# Patient Record
Sex: Male | Born: 1960 | Race: White | Hispanic: No | Marital: Married | Smoking: Never smoker
Health system: Southern US, Community
[De-identification: ages and names within clinical notes are randomized; demographics above are authoritative.]

## PROBLEM LIST (undated history)

## (undated) DIAGNOSIS — J189 Pneumonia, unspecified organism: Secondary | ICD-10-CM

## (undated) DIAGNOSIS — E119 Type 2 diabetes mellitus without complications: Secondary | ICD-10-CM

## (undated) DIAGNOSIS — C801 Malignant (primary) neoplasm, unspecified: Secondary | ICD-10-CM

## (undated) DIAGNOSIS — F329 Major depressive disorder, single episode, unspecified: Secondary | ICD-10-CM

## (undated) DIAGNOSIS — E785 Hyperlipidemia, unspecified: Secondary | ICD-10-CM

## (undated) DIAGNOSIS — F32A Depression, unspecified: Secondary | ICD-10-CM

## (undated) DIAGNOSIS — R011 Cardiac murmur, unspecified: Secondary | ICD-10-CM

## (undated) DIAGNOSIS — I10 Essential (primary) hypertension: Secondary | ICD-10-CM

## (undated) DIAGNOSIS — F419 Anxiety disorder, unspecified: Secondary | ICD-10-CM

## (undated) HISTORY — DX: Essential (primary) hypertension: I10

## (undated) HISTORY — PX: COLONOSCOPY: SHX174

## (undated) HISTORY — DX: Malignant (primary) neoplasm, unspecified: C80.1

## (undated) HISTORY — DX: Hyperlipidemia, unspecified: E78.5

## (undated) HISTORY — DX: Type 2 diabetes mellitus without complications: E11.9

## (undated) HISTORY — DX: Cardiac murmur, unspecified: R01.1

---
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
## 1998-05-08 HISTORY — PX: OTHER SURGICAL HISTORY: SHX169

## 1999-12-18 ENCOUNTER — Emergency Department (HOSPITAL_COMMUNITY): Admission: EM | Admit: 1999-12-18 | Discharge: 1999-12-18 | Payer: Self-pay | Admitting: Emergency Medicine

## 1999-12-18 ENCOUNTER — Encounter: Payer: Self-pay | Admitting: Emergency Medicine

## 1999-12-19 ENCOUNTER — Ambulatory Visit (HOSPITAL_COMMUNITY): Admission: RE | Admit: 1999-12-19 | Discharge: 1999-12-19 | Payer: Self-pay | Admitting: Internal Medicine

## 1999-12-19 ENCOUNTER — Encounter: Payer: Self-pay | Admitting: Internal Medicine

## 2012-05-22 ENCOUNTER — Other Ambulatory Visit (HOSPITAL_BASED_OUTPATIENT_CLINIC_OR_DEPARTMENT_OTHER): Payer: Self-pay | Admitting: Family Medicine

## 2012-05-22 ENCOUNTER — Other Ambulatory Visit: Payer: Self-pay | Admitting: Family Medicine

## 2012-05-22 ENCOUNTER — Ambulatory Visit (INDEPENDENT_AMBULATORY_CARE_PROVIDER_SITE_OTHER): Payer: PRIVATE HEALTH INSURANCE

## 2012-05-22 ENCOUNTER — Ambulatory Visit (HOSPITAL_BASED_OUTPATIENT_CLINIC_OR_DEPARTMENT_OTHER): Payer: Self-pay

## 2012-05-22 DIAGNOSIS — R05 Cough: Secondary | ICD-10-CM

## 2012-05-22 DIAGNOSIS — R0602 Shortness of breath: Secondary | ICD-10-CM

## 2013-05-08 DIAGNOSIS — C801 Malignant (primary) neoplasm, unspecified: Secondary | ICD-10-CM

## 2013-05-08 HISTORY — DX: Malignant (primary) neoplasm, unspecified: C80.1

## 2013-08-25 ENCOUNTER — Encounter (INDEPENDENT_AMBULATORY_CARE_PROVIDER_SITE_OTHER): Payer: Self-pay | Admitting: General Surgery

## 2013-08-25 ENCOUNTER — Other Ambulatory Visit (INDEPENDENT_AMBULATORY_CARE_PROVIDER_SITE_OTHER): Payer: Self-pay

## 2013-08-25 ENCOUNTER — Ambulatory Visit (INDEPENDENT_AMBULATORY_CARE_PROVIDER_SITE_OTHER): Payer: PRIVATE HEALTH INSURANCE | Admitting: General Surgery

## 2013-08-25 VITALS — BP 110/80 | HR 84 | Temp 97.2°F | Resp 16 | Ht 71.0 in | Wt 238.4 lb

## 2013-08-25 DIAGNOSIS — C189 Malignant neoplasm of colon, unspecified: Secondary | ICD-10-CM

## 2013-08-25 DIAGNOSIS — D126 Benign neoplasm of colon, unspecified: Secondary | ICD-10-CM

## 2013-08-25 MED ORDER — METRONIDAZOLE 500 MG PO TABS: 500.0000 mg | ORAL_TABLET | ORAL | Status: DC

## 2013-08-25 NOTE — Progress Notes (Signed)
Chief Complaint  Patient presents with  . New Evaluation    eval desending colon CA    HISTORY: Robert Moore is a 53 y.o. male who presents to the office with colon cancer.  This was found on screeing colonoscopy.  The polyp was resected piecemeal from the proximal descending colon.  Biopsy shows adenocarcinoma extending to the base or the resected margin on several specimens.  Workup thus far has included CT abd, which was neg for any metastatic diease, and a CEA, which was normal.  Pt denies any rectal bleeding or changes in bowel habits or weight loss.       Past Medical History  Diagnosis Date  . Cancer   . Diabetes mellitus without complication   . Hypertension   . Hyperlipidemia   . Heart murmur       Past Surgical History  Procedure Laterality Date  . Ear tuck  1975  . Bone spur Right 2000    foot       Current Outpatient Prescriptions  Medication Sig Dispense Refill  . metroNIDAZOLE (FLAGYL) 500 MG tablet Take 1 tablet (500 mg total) by mouth as directed.  3 tablet  0   No current facility-administered medications for this visit.      No Known Allergies    History reviewed. No pertinent family history.    History   Social History  . Marital Status: Married    Spouse Name: N/A    Number of Children: N/A  . Years of Education: N/A   Social History Main Topics  . Smoking status: Never Smoker   . Smokeless tobacco: Never Used  . Alcohol Use: Yes     Comment: occ  . Drug Use: No  . Sexual Activity: None   Other Topics Concern  . None   Social History Narrative  . None       REVIEW OF SYSTEMS - PERTINENT POSITIVES ONLY: Review of Systems - General ROS: negative for - chills or fever Respiratory ROS: no cough, shortness of breath, or wheezing Cardiovascular ROS: no chest pain or dyspnea on exertion Gastrointestinal ROS: no abdominal pain, change in bowel habits, or black or bloody stools Genito-Urinary ROS: no dysuria, trouble voiding, or  hematuria  EXAM: Filed Vitals:   08/25/13 0941  BP: 110/80  Pulse: 84  Temp: 97.2 F (36.2 C)  Resp: 16    Gen:  No acute distress.  Well nourished and well groomed.   Neurological: Alert and oriented to person, place, and time. Coordination normal.  Head: Normocephalic and atraumatic.  Eyes: Conjunctivae are normal. Pupils are equal, round, and reactive to light. No scleral icterus.  Neck: Normal range of motion. Neck supple. No tracheal deviation or thyromegaly present.  No cervical lymphadenopathy. Cardiovascular: Normal rate, regular rhythm, normal heart sounds and intact distal pulses. Respiratory: Effort normal.  No respiratory distress. No chest wall tenderness. Breath sounds normal.  No wheezes, rales or rhonchi.  GI: Soft. Bowel sounds are normal. The abdomen is soft and nontender.  There is no rebound and no guarding.  Umbilical hernia present Musculoskeletal: Normal range of motion. Extremities are nontender.  Skin: Skin is warm and dry. No rash noted. No diaphoresis. No erythema. No pallor. No clubbing, cyanosis, or edema.   Psychiatric: Normal mood and affect. Behavior is normal. Judgment and thought content normal.    LABORATORY RESULTS: Available labs are reviewed  CEA: 1.9 WBC 9.7 Hgb: 16.1  RADIOLOGY RESULTS:   Images and reports are  reviewed. CT abd report reviewed.  No signs of metastatic disease.  Small liver masses: "probable cysts", mass at base of bladder    ASSESSMENT AND PLAN: Robert Moore is a 53 y.o. M with a colon cancer found on screening colonoscopy.  This was tattooed and found in the proximal descending colon.  We will complete his workup by obtaining a CT Chest.  I would also like to review the CT Abd images myself.  He has been seen by Dr Felipa Eth for this bladder mass, and cystoscopy did not reveal any mass per patient.  I will obtain these records as well.  Once this is complete, we will schedule for surgery.  Most likely it will be  Laparoscopic L colectomy.  We discussed the surgery in detail.  We discussed alternatives, which include repeat colonoscopy and biopsies with close endoscopic surveillance.  He has elected to proceed with surgery instead.  We discussed the possibility of finding nothing at all or a deeper cancer.    The surgery and anatomy were described to the patient as well as the risks of surgery and the possible complications.  These include: Bleeding, infection and possible wound complications such as hernia, damage to adjacent structures, leak of surgical connections, which can lead to other surgeries and possibly an ostomy (5-7%), possible need for other procedures, such as abscess drains in radiology, possible prolonged hospital stay, possible diarrhea from removal of part of the colon, possible constipation from narcotics, prolonged fatigue/weakness or appetite loss, possible early recurrence of cancer, possible complications of their medical problems such as heart disease or arrhythmias or lung problems, death (less than 1%).  I believe the patient understands and wishes to proceed with the surgery.     Rosario Adie, MD Colon and Rectal Surgery / [facility]      Visit Diagnoses: 1. Colon cancer     Primary Care Physician: Amador Cunas, FNP

## 2013-08-25 NOTE — Patient Instructions (Signed)
Colorectal Cancer  Colorectal cancer is the second most common cancer in the [place], striking 140,000 people annually and causing 60,000 deaths. That's a staggering figure when you consider the disease is potentially curable if diagnosed in the early stages. Who is at risk? Though colorectal cancer may occur at any age, more than 90% of the patients are over age 53, at which point the risk doubles every ten years. In addition to age, other high risk factors include a family history of colorectal cancer and polyps and a personal history of ulcerative colitis, colon polyps or cancer of other organs, especially of the breast or uterus. How does it start? It is generally agreed that nearly all colon and rectal cancer begins in benign polyps. These pre-malignant growths occur on the bowel wall and may eventually increase in size and become cancer. Removal of benign polyps is one aspect of preventive medicine that really works! What are the symptoms? The most common symptoms are rectal bleeding and changes in bowel habits, such as constipation or diarrhea. (These symptoms are also common in other diseases so it is important you receive a thorough examination should you experience them.) Abdominal pain and weight loss are usually late symptoms indicating possible extensive disease. Unfortunately, many polyps and early cancers fail to produce symptoms. Therefore, it is important that your routine physical includes colorectal cancer detection procedures once you reach age 50.  There are several methods for detection of colorectal cancer. These include digital rectal examination, a chemical test of the stool for blood, flexible sigmoidoscopy and colonoscopy (lighted tubular instruments used to inspect the lower bowel) and barium enema. Be sure to discuss these options with your surgeon to determine which procedure is best for you. Individuals who have a first-degree relative (parent or sibling) with colon  cancer or polyps should start their colon cancer screening at the age of 53. How is colorectal cancer treated? Colorectal cancer requires surgery in nearly all cases for complete cure. Radiation and chemotherapy are sometimes used in addition to surgery. Between 80-90% are restored to normal health if the cancer is detected and treated in the earliest stages. The cure rate drops to 50% or less when diagnosed in the later stages. Thanks to modern technology, less than 5% of all colorectal cancer patients require a colostomy, the surgical construction of an artificial excretory opening from the colon. Can colon cancer be prevented? Colon cancer is very preventable. The most important step towards preventing colon cancer is getting a screening test. Any abnormal screening test should be followed by a colonoscopy. Some individuals prefer to start with colonoscopy as a screening test. Colonoscopy provides a detailed examination of the bowel. Polyps can be identified and can often be removed during colonoscopy. Though not definitely proven, there is some evidence that diet may play a significant role in preventing colorectal cancer. As far as we know, a high fiber, low fat diet is the only dietary measure that might help prevent colorectal cancer. Finally, pay attention to changes in your bowel habits. Any new changes such as persistent constipation, diarrhea, or blood in the stool should be discussed with your physician.   Can hemorrhoids lead to colon cancer? No, but hemorrhoids may produce symptoms similar to colon polyps or cancer. Should you experience these symptoms, you should have them examined and evaluated by a physician, preferably by a colon and rectal surgeon. What is a colon and rectal surgeon? Colon and rectal surgeons are experts in the surgical and non-surgical treatment   of diseases of the colon, rectum and anus. They have completed advanced surgical training in the treatment of these  diseases as well as full general surgical training. Board-certified colon and rectal surgeons complete residencies in general surgery and colon and rectal surgery, and pass intensive examinations conducted by the American Board of Surgery and the American Board of Colon and Rectal Surgery. They are well-versed in the treatment of both benign and malignant diseases of the colon, rectum and anus and are able to perform routine screening examinations and surgically treat conditions if indicated to do so.  2012 American Society of Colon & Rectal Surgeons     [facility]  ONE-DAY (1) PRE-OP HOME COLON PREP INSTRUCTIONS: ** MIRALAX / GATORADE PREP / FLAGYL**  You must follow the instructions below carefully.  If you have questions or problems, please call and speak to someone in the clinic department at our office:   [phone].     INSTRUCTIONS: 1. Five days prior to your procedure do not eat nuts, popcorn, or fruit with seeds.  Stop all fiber supplements such as Metamucil, Citrucel, etc. 2. Two days before surgery fill the prescription at a pharmacy of your choice and purchase the additional supplies below.         MIRALAX - GATORADE -- DULCOLAX TABS -- FLEET ENEMA:   Purchase a bottle of MIRALAX  (255 gm bottle)    In addition, purchase four (4) DULCOLAX TABLETS (no prescription required- ask the pharmacist if you can't find them)   Purchase one Fleet Enema (Green and white box)    Purchase one 64 oz GATORADE.  (Do NOT purchase red Gatorade; any other flavor is acceptable) and place in refrigerator to get cold.  3.   Day Before Surgery:   6 am: Wash you abdomen with soap and repeat this on the morning of surgery and take 4 Dulcolax tablets   You may only have clear liquids (tea, coffee, juice, broth, jello, soft drinks, gummy bears).  You cannot have solid foods, cream, milk or milk products.  Drink at lease 8 ounces of liquids every hour while awake.   Take the Flagyl prescription  as directed at 8 am, 2 pm and 8 pm.  It is helpful to take this with some jello instead of on an empty stomach.  Any flavor is ok, except red jello, which will cause red stools.   Mix the entire bottle of MiraLax and the Gatorade in a large container.    10:00am: Begin drinking the Gatorade mixture until gone (8 oz every 15-30 minutes).      You may suck on a lime wedge or hard candy to "freshen your palate" in between glasses   If you are a diabetic, take your blood sugar reading several time throughout the prep.  Have some juice available to take if your sugar level gets too low   You may feel chilled while taking the prep.  Have some warm tea or broth to help warm up.   Continue clear liquids until midnight or bedtime  3. The day of your procedure:   Do not eat or drink ANYTHING after midnight before your surgery.     If you take Heart or Blood Pressure medicine, ask the pre-op nurses about these during your preop appointment.   Take a regular Fleet Enema one hour before leaving the come to the hospital.  Try to hold it in 5-10 mins before expelling.   Further pre-operative instructions will be   given to you from the hospital.   Expect to be contacted 5-7 days before your surgery.     

## 2013-08-26 ENCOUNTER — Ambulatory Visit (HOSPITAL_COMMUNITY): Payer: PRIVATE HEALTH INSURANCE

## 2013-08-28 ENCOUNTER — Telehealth (INDEPENDENT_AMBULATORY_CARE_PROVIDER_SITE_OTHER): Payer: Self-pay

## 2013-08-28 NOTE — Telephone Encounter (Signed)
Pt calling b/c he wanted to know about the CT Chest that he had scheduled but now has been canceled. The pt just wants to know what is going on b/c he has not heard from our office. I spoke to [facility] and she said the pt had already got a CT chest, Abd, & pelvis done on 08/20/13 at [facility]. Glenda canceled the CT chest until she speaks to Dr Marcello Moores. Glenda did call to get the CD of the CT along with the report and she is waiting for Dr Marcello Moores to come back into the office to review. I advised pt that we will call him once we have Dr Marcello Moores review the CD.

## 2013-08-29 ENCOUNTER — Telehealth (INDEPENDENT_AMBULATORY_CARE_PROVIDER_SITE_OTHER): Payer: Self-pay | Admitting: General Surgery

## 2013-08-29 ENCOUNTER — Ambulatory Visit (HOSPITAL_COMMUNITY): Payer: PRIVATE HEALTH INSURANCE

## 2013-08-29 NOTE — Telephone Encounter (Signed)
Dr Marcello Moores  Having trouble getting assist on Ah [facility] case.  Would you consider doing during your LDOW week?

## 2013-08-29 NOTE — Telephone Encounter (Signed)
After speaking to DR. Marcello Moores called  Patient to inform him , he will be getting a call from [place] scheduling to schedule his surgery. He is to stop all Nsaids 7 days prior to surgery. Patient verbalized understanding

## 2013-09-04 ENCOUNTER — Ambulatory Visit (INDEPENDENT_AMBULATORY_CARE_PROVIDER_SITE_OTHER): Payer: Self-pay | Admitting: General Surgery

## 2013-09-12 ENCOUNTER — Encounter (HOSPITAL_COMMUNITY): Payer: Self-pay | Admitting: Pharmacy Technician

## 2013-09-16 NOTE — Patient Instructions (Addendum)
Your procedure is scheduled on:  09/25/13 THURSDAY  Report to [facility] at  [place]     AM.  Call this number if you have problems the morning of surgery: [place] AS PER OFFICE TAKE ONE HALF DOSAGE INSULIN DOSAGE  Wednesday  NIGHT  Do not TAKE ANYTHING BY MOUTH :After Midnight. Wednesday NIGHT   Take these medicines the morning of surgery with A SIP OF WATER:NONE DO NOT TAKE ANY DIABETES MEDICATION Thursday MORNING   .  Contacts, dentures or partial plates, or metal hairpins  can not be worn to surgery. Your family will be responsible for glasses, dentures, hearing aides while you are in surgery  Leave suitcase in the car. After surgery it may be brought to your room.  For patients admitted to the hospital, checkout time is 11:00 AM day of  discharge.                DO NOT WEAR JEWELRY, LOTIONS, POWDERS, OR PERFUMES.  WOMEN-- DO NOT SHAVE LEGS OR UNDERARMS FOR 48 HOURS BEFORE SHOWERS. MEN MAY SHAVE FACE.  Patients discharged the day of surgery will not be allowed to drive home. IF going home the day of surgery, you must have a driver and someone to stay with you for the first 24 hours  Name and phone number of your driver:                                                                                                                                         [facility] - Preparing for Surgery Before surgery, you can play an important role.  Because skin is not sterile, your skin needs to be as free of germs as possible.  You can reduce the number of germs on your skin by washing with CHG (chlorahexidine gluconate) soap before surgery.  CHG is an antiseptic cleaner which kills germs and bonds with the skin to continue killing germs even after washing. Please DO NOT use if you have an allergy to CHG or antibacterial soaps.  If your skin becomes reddened/irritated stop using the CHG and inform your nurse when you arrive at Short Stay. Do not shave  (including legs and underarms) for at least 48 hours prior to the first CHG shower.  You may shave your face. Please follow these instructions carefully:  1.  Shower with CHG Soap the night before surgery and the  morning of Surgery.  2.  If you choose to wash your hair, wash your hair first as usual with your  normal  shampoo.  3.  After you shampoo, rinse your hair and body thoroughly to remove the  shampoo.                           4.  Use  CHG as you would any other liquid soap.  You can apply chg directly  to the skin and wash                       Gently with a scrungie or clean washcloth.  5.  Apply the CHG Soap to your body ONLY FROM THE NECK DOWN.   Do not use on open                           Wound or open sores. Avoid contact with eyes, ears mouth and genitals (private parts).                        Genitals (private parts) with your normal soap.             6.  Wash thoroughly, paying special attention to the area where your surgery  will be performed.  7.  Thoroughly rinse your body with warm water from the neck down.  8.  DO NOT shower/wash with your normal soap after using and rinsing off  the CHG Soap.                9.  Pat yourself dry with a clean towel.            10.  Wear clean pajamas.            11.  Place clean sheets on your bed the night of your first shower and do not  sleep with pets. Day of Surgery : Do not apply any lotions/deodorants the morning of surgery.  Please wear clean clothes to the hospital/surgery center.  FAILURE TO FOLLOW THESE INSTRUCTIONS MAY RESULT IN THE CANCELLATION OF YOUR SURGERY PATIENT SIGNATURE_________________________________  NURSE SIGNATURE__________________________________  ________________________________________________________________________    CLEAR LIQUID DIET   Foods Allowed                                                                     Foods Excluded  Coffee and tea, regular and decaf                              liquids that you cannot  Plain Jell-O in any flavor                                             see through such as: Fruit ices (not with fruit pulp)                                     milk, soups, orange juice  Iced Popsicles                                    All solid food Carbonated beverages, regular and diet  Cranberry, grape and apple juices Sports drinks like Gatorade Lightly seasoned clear broth or consume(fat free) Sugar, honey syrup  Sample Menu Breakfast                                Lunch                                     Supper Cranberry juice                    Beef broth                            Chicken broth Jell-O                                     Grape juice                           Apple juice Coffee or tea                        Jell-O                                      Popsicle                                                Coffee or tea                        Coffee or tea  _____________________________________________________________________

## 2013-09-17 ENCOUNTER — Encounter (HOSPITAL_COMMUNITY): Payer: Self-pay

## 2013-09-17 ENCOUNTER — Ambulatory Visit (HOSPITAL_COMMUNITY)
Admission: RE | Admit: 2013-09-17 | Discharge: 2013-09-17 | Disposition: A | Discharge status: Home / Self Care | Payer: PRIVATE HEALTH INSURANCE | Source: Ambulatory Visit | Attending: Anesthesiology | Admitting: Anesthesiology

## 2013-09-17 ENCOUNTER — Encounter (HOSPITAL_COMMUNITY)
Admission: RE | Admit: 2013-09-17 | Discharge: 2013-09-17 | Disposition: A | Discharge status: Home / Self Care | Payer: PRIVATE HEALTH INSURANCE | Source: Ambulatory Visit | Attending: General Surgery | Admitting: General Surgery

## 2013-09-17 DIAGNOSIS — Z01810 Encounter for preprocedural cardiovascular examination: Secondary | ICD-10-CM | POA: Insufficient documentation

## 2013-09-17 DIAGNOSIS — Z01812 Encounter for preprocedural laboratory examination: Secondary | ICD-10-CM | POA: Insufficient documentation

## 2013-09-17 DIAGNOSIS — Z01818 Encounter for other preprocedural examination: Secondary | ICD-10-CM | POA: Insufficient documentation

## 2013-09-17 HISTORY — DX: Major depressive disorder, single episode, unspecified: F32.9

## 2013-09-17 HISTORY — DX: Pneumonia, unspecified organism: J18.9

## 2013-09-17 HISTORY — DX: Anxiety disorder, unspecified: F41.9

## 2013-09-17 HISTORY — DX: Depression, unspecified: F32.A

## 2013-09-17 LAB — BASIC METABOLIC PANEL
BUN: 15 mg/dL (ref 6–23)
CO2: 28 meq/L (ref 19–32)
Calcium: 9.7 mg/dL (ref 8.4–10.5)
Chloride: 100 mEq/L (ref 96–112)
Creatinine, Ser: 1.28 mg/dL (ref 0.50–1.35)
GFR calc Af Amer: 73 mL/min — ABNORMAL LOW (ref >90)
GFR, EST NON AFRICAN AMERICAN: 63 mL/min — AB (ref >90)
GLUCOSE: 290 mg/dL — AB (ref 70–99)
POTASSIUM: 4.4 meq/L (ref 3.7–5.3)
SODIUM: 141 meq/L (ref 137–147)

## 2013-09-17 LAB — CBC
HCT: 42.4 % (ref 39.0–52.0)
Hemoglobin: 14.6 g/dL (ref 13.0–17.0)
MCH: 30.7 pg (ref 26.0–34.0)
MCHC: 34.4 g/dL (ref 30.0–36.0)
MCV: 89.1 fL (ref 78.0–100.0)
Platelets: 178 10*3/uL (ref 150–400)
RBC: 4.76 MIL/uL (ref 4.22–5.81)
RDW: 13.3 % (ref 11.5–15.5)
WBC: 8.7 10*3/uL (ref 4.0–10.5)

## 2013-09-17 NOTE — Progress Notes (Signed)
09/17/13 1410  OBSTRUCTIVE SLEEP APNEA  Have you ever been diagnosed with sleep apnea through a sleep study? No  Do you snore loudly (loud enough to be heard through closed doors)?  1  Do you often feel tired, fatigued, or sleepy during the daytime? 0  Has anyone observed you stop breathing during your sleep? 1  Do you have, or are you being treated for high blood pressure? 1  BMI more than 35 kg/m2? 0  Age over 53 years old? 1  Neck circumference greater than 40 cm/16 inches? 0  Gender: 1  Obstructive Sleep Apnea Score 5  Score 4 or greater  Results sent to PCP

## 2013-09-18 NOTE — Progress Notes (Signed)
Patient has multiple piercings- ears, nipple-  Instructed not to wear any jewlery day of surgery-  Stated "I will be putting plastic studs in or the holes will close up"  I instructed him that for a safety issue this is not standard practice

## 2013-09-24 NOTE — Anesthesia Preprocedure Evaluation (Addendum)
Anesthesia Evaluation  Patient identified by MRN, date of birth, ID band Patient awake    Reviewed: Allergy & Precautions, H&P , NPO status , Patient's Chart, lab work & pertinent test results  Airway Mallampati: II TM Distance: <3 FB Neck ROM: Full    Dental no notable dental hx. (+) Dental Advisory Given   Pulmonary neg pulmonary ROS,  breath sounds clear to auscultation  Pulmonary exam normal       Cardiovascular hypertension, Pt. on medications Rhythm:Regular Rate:Normal     Neuro/Psych negative neurological ROS  negative psych ROS   GI/Hepatic negative GI ROS, Neg liver ROS,   Endo/Other  diabetes, Insulin Dependent  Renal/GU negative Renal ROS  negative genitourinary   Musculoskeletal negative musculoskeletal ROS (+)   Abdominal   Peds negative pediatric ROS (+)  Hematology negative hematology ROS (+)   Anesthesia Other Findings   Reproductive/Obstetrics negative OB ROS                         Anesthesia Physical Anesthesia Plan  ASA: III  Anesthesia Plan: General   Post-op Pain Management:    Induction: Intravenous  Airway Management Planned: Oral ETT  Additional Equipment:   Intra-op Plan:   Post-operative Plan: Extubation in OR  Informed Consent: I have reviewed the patients History and Physical, chart, labs and discussed the procedure including the risks, benefits and alternatives for the proposed anesthesia with the patient or authorized representative who has indicated his/her understanding and acceptance.   Dental advisory given  Plan Discussed with: CRNA and Surgeon  Anesthesia Plan Comments:         Anesthesia Quick Evaluation

## 2013-09-25 ENCOUNTER — Encounter (HOSPITAL_COMMUNITY): Payer: PRIVATE HEALTH INSURANCE | Admitting: Anesthesiology

## 2013-09-25 ENCOUNTER — Inpatient Hospital Stay (HOSPITAL_COMMUNITY)
Admission: RE | Admit: 2013-09-25 | Discharge: 2013-09-29 | DRG: 331 | Disposition: A | Discharge status: Home / Self Care | Payer: PRIVATE HEALTH INSURANCE | Source: Ambulatory Visit | Attending: General Surgery | Admitting: General Surgery

## 2013-09-25 ENCOUNTER — Inpatient Hospital Stay (HOSPITAL_COMMUNITY): Payer: PRIVATE HEALTH INSURANCE | Admitting: Anesthesiology

## 2013-09-25 ENCOUNTER — Encounter (HOSPITAL_COMMUNITY): Payer: Self-pay | Admitting: *Deleted

## 2013-09-25 ENCOUNTER — Encounter (HOSPITAL_COMMUNITY)
Admission: RE | Disposition: A | Discharge status: Home / Self Care | Payer: Self-pay | Source: Ambulatory Visit | Attending: General Surgery

## 2013-09-25 DIAGNOSIS — C189 Malignant neoplasm of colon, unspecified: Secondary | ICD-10-CM

## 2013-09-25 DIAGNOSIS — C186 Malignant neoplasm of descending colon: Principal | ICD-10-CM | POA: Diagnosis present

## 2013-09-25 DIAGNOSIS — E119 Type 2 diabetes mellitus without complications: Secondary | ICD-10-CM | POA: Diagnosis present

## 2013-09-25 DIAGNOSIS — I10 Essential (primary) hypertension: Secondary | ICD-10-CM | POA: Diagnosis present

## 2013-09-25 DIAGNOSIS — E785 Hyperlipidemia, unspecified: Secondary | ICD-10-CM | POA: Diagnosis present

## 2013-09-25 HISTORY — PX: LAPAROSCOPIC PARTIAL COLECTOMY: SHX5907

## 2013-09-25 LAB — GLUCOSE, CAPILLARY
GLUCOSE-CAPILLARY: 137 mg/dL — AB (ref 70–99)
GLUCOSE-CAPILLARY: 268 mg/dL — AB (ref 70–99)
Glucose-Capillary: 243 mg/dL — ABNORMAL HIGH (ref 70–99)

## 2013-09-25 LAB — TYPE AND SCREEN
ABO/RH(D): A POS
Antibody Screen: NEGATIVE

## 2013-09-25 LAB — ABO/RH: ABO/RH(D): A POS

## 2013-09-25 SURGERY — LAPAROSCOPIC PARTIAL COLECTOMY
Anesthesia: General | Site: Abdomen

## 2013-09-25 MED ORDER — NEOSTIGMINE METHYLSULFATE 10 MG/10ML IV SOLN
INTRAVENOUS | Status: DC | PRN
  Administered 2013-09-25: 4 mg via INTRAVENOUS

## 2013-09-25 MED ORDER — PROPOFOL 10 MG/ML IV BOLUS
INTRAVENOUS | Status: AC
  Filled 2013-09-25: qty 20

## 2013-09-25 MED ORDER — HYDROMORPHONE HCL PF 1 MG/ML IJ SOLN
INTRAMUSCULAR | Status: AC
  Filled 2013-09-25: qty 1

## 2013-09-25 MED ORDER — MIDAZOLAM HCL 2 MG/2ML IJ SOLN
INTRAMUSCULAR | Status: AC
  Filled 2013-09-25: qty 2

## 2013-09-25 MED ORDER — ROCURONIUM BROMIDE 100 MG/10ML IV SOLN
INTRAVENOUS | Status: DC | PRN
  Administered 2013-09-25: 30 mg via INTRAVENOUS
  Administered 2013-09-25 (×2): 10 mg via INTRAVENOUS
  Administered 2013-09-25: 20 mg via INTRAVENOUS
  Administered 2013-09-25 (×2): 10 mg via INTRAVENOUS

## 2013-09-25 MED ORDER — BUPIVACAINE-EPINEPHRINE 0.25% -1:200000 IJ SOLN
INTRAMUSCULAR | Status: AC
  Filled 2013-09-25: qty 1

## 2013-09-25 MED ORDER — GLYCOPYRROLATE 0.2 MG/ML IJ SOLN
INTRAMUSCULAR | Status: DC | PRN
  Administered 2013-09-25: 0.6 mg via INTRAVENOUS

## 2013-09-25 MED ORDER — SODIUM CHLORIDE 0.9 % IJ SOLN: 9.0000 mL | INTRAMUSCULAR | Status: DC | PRN

## 2013-09-25 MED ORDER — DIPHENHYDRAMINE HCL 50 MG/ML IJ SOLN: 12.5000 mg | Freq: Four times a day (QID) | INTRAMUSCULAR | Status: DC | PRN

## 2013-09-25 MED ORDER — ALVIMOPAN 12 MG PO CAPS
12.0000 mg | ORAL_CAPSULE | Freq: Once | ORAL | Status: AC
  Administered 2013-09-25: 12 mg via ORAL
  Filled 2013-09-25: qty 1

## 2013-09-25 MED ORDER — CEFOTETAN DISODIUM 2 G IJ SOLR
2.0000 g | Freq: Two times a day (BID) | INTRAMUSCULAR | Status: AC
  Administered 2013-09-25: 2 g via INTRAVENOUS
  Filled 2013-09-25 (×2): qty 2

## 2013-09-25 MED ORDER — PROMETHAZINE HCL 25 MG/ML IJ SOLN
INTRAMUSCULAR | Status: AC
  Filled 2013-09-25: qty 1

## 2013-09-25 MED ORDER — LABETALOL HCL 5 MG/ML IV SOLN
INTRAVENOUS | Status: AC
  Filled 2013-09-25: qty 4

## 2013-09-25 MED ORDER — NALOXONE HCL 0.4 MG/ML IJ SOLN: 0.4000 mg | INTRAMUSCULAR | Status: DC | PRN

## 2013-09-25 MED ORDER — BUPROPION HCL ER (XL) 300 MG PO TB24
300.0000 mg | ORAL_TABLET | Freq: Every morning | ORAL | Status: DC
  Administered 2013-09-26 – 2013-09-29 (×4): 300 mg via ORAL
  Filled 2013-09-25 (×4): qty 1

## 2013-09-25 MED ORDER — INSULIN ASPART 100 UNIT/ML ~~LOC~~ SOLN
0.0000 [IU] | SUBCUTANEOUS | Status: DC
  Administered 2013-09-25: 7 [IU] via SUBCUTANEOUS
  Administered 2013-09-26: 3 [IU] via SUBCUTANEOUS
  Administered 2013-09-26: 4 [IU] via SUBCUTANEOUS
  Administered 2013-09-26: 7 [IU] via SUBCUTANEOUS
  Administered 2013-09-26: 4 [IU] via SUBCUTANEOUS
  Administered 2013-09-26: 3 [IU] via SUBCUTANEOUS
  Administered 2013-09-26 – 2013-09-27 (×5): 7 [IU] via SUBCUTANEOUS
  Administered 2013-09-27: 4 [IU] via SUBCUTANEOUS
  Administered 2013-09-27: 7 [IU] via SUBCUTANEOUS
  Administered 2013-09-28 (×2): 4 [IU] via SUBCUTANEOUS
  Administered 2013-09-28: 7 [IU] via SUBCUTANEOUS
  Administered 2013-09-28: 4 [IU] via SUBCUTANEOUS

## 2013-09-25 MED ORDER — FENTANYL CITRATE 0.05 MG/ML IJ SOLN
INTRAMUSCULAR | Status: AC
  Filled 2013-09-25: qty 2

## 2013-09-25 MED ORDER — ALPRAZOLAM 0.25 MG PO TABS
0.2500 mg | ORAL_TABLET | Freq: Three times a day (TID) | ORAL | Status: DC | PRN
  Administered 2013-09-25 – 2013-09-28 (×7): 0.25 mg via ORAL
  Filled 2013-09-25 (×7): qty 1

## 2013-09-25 MED ORDER — DEXTROSE 5 % IV SOLN
2.0000 g | INTRAVENOUS | Status: AC
  Administered 2013-09-25: 2 g via INTRAVENOUS
  Filled 2013-09-25: qty 2

## 2013-09-25 MED ORDER — MORPHINE SULFATE (PF) 1 MG/ML IV SOLN
INTRAVENOUS | Status: AC
  Filled 2013-09-25: qty 25

## 2013-09-25 MED ORDER — LACTATED RINGERS IR SOLN
Status: DC | PRN
  Administered 2013-09-25: 1000 mL

## 2013-09-25 MED ORDER — DIPHENHYDRAMINE HCL 12.5 MG/5ML PO ELIX: 12.5000 mg | ORAL_SOLUTION | Freq: Four times a day (QID) | ORAL | Status: DC | PRN

## 2013-09-25 MED ORDER — HYDROCHLOROTHIAZIDE 12.5 MG PO CAPS
12.5000 mg | ORAL_CAPSULE | Freq: Every day | ORAL | Status: DC
  Administered 2013-09-26 – 2013-09-29 (×4): 12.5 mg via ORAL
  Filled 2013-09-25 (×5): qty 1

## 2013-09-25 MED ORDER — LISINOPRIL-HYDROCHLOROTHIAZIDE 20-12.5 MG PO TABS: 1.0000 | ORAL_TABLET | Freq: Every morning | ORAL | Status: DC

## 2013-09-25 MED ORDER — PROPOFOL 10 MG/ML IV BOLUS
INTRAVENOUS | Status: DC | PRN
  Administered 2013-09-25: 200 mg via INTRAVENOUS

## 2013-09-25 MED ORDER — KETOROLAC TROMETHAMINE 30 MG/ML IJ SOLN: 15.0000 mg | Freq: Once | INTRAMUSCULAR | Status: DC | PRN

## 2013-09-25 MED ORDER — PHENOL 1.4 % MT LIQD
1.0000 | OROMUCOSAL | Status: DC | PRN
  Filled 2013-09-25: qty 177

## 2013-09-25 MED ORDER — HEPARIN SODIUM (PORCINE) 5000 UNIT/ML IJ SOLN
5000.0000 [IU] | Freq: Once | INTRAMUSCULAR | Status: AC
  Administered 2013-09-25: 5000 [IU] via SUBCUTANEOUS
  Filled 2013-09-25: qty 1

## 2013-09-25 MED ORDER — GLIPIZIDE 10 MG PO TABS
10.0000 mg | ORAL_TABLET | Freq: Two times a day (BID) | ORAL | Status: DC
  Administered 2013-09-26 – 2013-09-29 (×7): 10 mg via ORAL
  Filled 2013-09-25 (×11): qty 1

## 2013-09-25 MED ORDER — ONDANSETRON HCL 4 MG/2ML IJ SOLN
INTRAMUSCULAR | Status: AC
  Filled 2013-09-25: qty 2

## 2013-09-25 MED ORDER — CEFOTETAN DISODIUM-DEXTROSE 2-2.08 GM-% IV SOLR
INTRAVENOUS | Status: AC
  Filled 2013-09-25: qty 50

## 2013-09-25 MED ORDER — PROMETHAZINE HCL 25 MG/ML IJ SOLN
6.2500 mg | INTRAMUSCULAR | Status: DC | PRN
  Administered 2013-09-25: 12.5 mg via INTRAVENOUS

## 2013-09-25 MED ORDER — HYDROMORPHONE HCL PF 1 MG/ML IJ SOLN
0.2500 mg | INTRAMUSCULAR | Status: DC | PRN
  Administered 2013-09-25: 0.25 mg via INTRAVENOUS
  Administered 2013-09-25 (×2): 0.5 mg via INTRAVENOUS

## 2013-09-25 MED ORDER — ONDANSETRON HCL 4 MG/2ML IJ SOLN
INTRAMUSCULAR | Status: DC | PRN
Start: 2013-09-25 — End: 2013-09-25
  Administered 2013-09-25: 4 mg via INTRAVENOUS

## 2013-09-25 MED ORDER — ENOXAPARIN SODIUM 40 MG/0.4ML ~~LOC~~ SOLN
40.0000 mg | SUBCUTANEOUS | Status: DC
  Administered 2013-09-26 – 2013-09-28 (×3): 40 mg via SUBCUTANEOUS
  Filled 2013-09-25 (×5): qty 0.4

## 2013-09-25 MED ORDER — ALVIMOPAN 12 MG PO CAPS
12.0000 mg | ORAL_CAPSULE | Freq: Two times a day (BID) | ORAL | Status: DC
  Administered 2013-09-26 – 2013-09-27 (×3): 12 mg via ORAL
  Filled 2013-09-25 (×4): qty 1

## 2013-09-25 MED ORDER — BUPIVACAINE-EPINEPHRINE 0.25% -1:200000 IJ SOLN
INTRAMUSCULAR | Status: DC | PRN
  Administered 2013-09-25: 25 mL

## 2013-09-25 MED ORDER — INSULIN GLARGINE 100 UNIT/ML ~~LOC~~ SOLN: 75.0000 [IU] | Freq: Every day | SUBCUTANEOUS | Status: DC

## 2013-09-25 MED ORDER — KCL IN DEXTROSE-NACL 20-5-0.45 MEQ/L-%-% IV SOLN
INTRAVENOUS | Status: DC
  Administered 2013-09-25 – 2013-09-28 (×7): via INTRAVENOUS
  Filled 2013-09-25 (×8): qty 1000

## 2013-09-25 MED ORDER — MIDAZOLAM HCL 5 MG/5ML IJ SOLN
INTRAMUSCULAR | Status: DC | PRN
  Administered 2013-09-25: 2 mg via INTRAVENOUS

## 2013-09-25 MED ORDER — METFORMIN HCL 500 MG PO TABS
1000.0000 mg | ORAL_TABLET | Freq: Two times a day (BID) | ORAL | Status: DC
  Administered 2013-09-27 – 2013-09-29 (×5): 1000 mg via ORAL
  Filled 2013-09-25 (×7): qty 2

## 2013-09-25 MED ORDER — ONDANSETRON HCL 4 MG/2ML IJ SOLN: 4.0000 mg | Freq: Four times a day (QID) | INTRAMUSCULAR | Status: DC | PRN

## 2013-09-25 MED ORDER — ACETAMINOPHEN 500 MG PO TABS
1000.0000 mg | ORAL_TABLET | Freq: Four times a day (QID) | ORAL | Status: AC
  Administered 2013-09-25 – 2013-09-26 (×3): 1000 mg via ORAL
  Filled 2013-09-25 (×3): qty 2

## 2013-09-25 MED ORDER — FENTANYL CITRATE 0.05 MG/ML IJ SOLN
INTRAMUSCULAR | Status: AC
  Filled 2013-09-25: qty 5

## 2013-09-25 MED ORDER — LIDOCAINE HCL (PF) 2 % IJ SOLN
INTRAMUSCULAR | Status: DC | PRN
  Administered 2013-09-25: 30 mg via INTRADERMAL

## 2013-09-25 MED ORDER — LABETALOL HCL 5 MG/ML IV SOLN
INTRAVENOUS | Status: DC | PRN
  Administered 2013-09-25: 5 mg via INTRAVENOUS

## 2013-09-25 MED ORDER — LISINOPRIL 20 MG PO TABS
20.0000 mg | ORAL_TABLET | Freq: Every day | ORAL | Status: DC
Start: 2013-09-25 — End: 2013-09-29
  Administered 2013-09-26 – 2013-09-29 (×4): 20 mg via ORAL
  Filled 2013-09-25 (×5): qty 1

## 2013-09-25 MED ORDER — SUCCINYLCHOLINE CHLORIDE 20 MG/ML IJ SOLN
INTRAMUSCULAR | Status: DC | PRN
  Administered 2013-09-25: 140 mg via INTRAVENOUS

## 2013-09-25 MED ORDER — BIOTENE DRY MOUTH MT LIQD
15.0000 mL | Freq: Two times a day (BID) | OROMUCOSAL | Status: DC
  Administered 2013-09-26 – 2013-09-29 (×4): 15 mL via OROMUCOSAL

## 2013-09-25 MED ORDER — INSULIN ASPART 100 UNIT/ML ~~LOC~~ SOLN
10.0000 [IU] | Freq: Once | SUBCUTANEOUS | Status: AC
  Administered 2013-09-25: 10 [IU] via SUBCUTANEOUS

## 2013-09-25 MED ORDER — LACTATED RINGERS IV SOLN
INTRAVENOUS | Status: DC | PRN
  Administered 2013-09-25 (×2): via INTRAVENOUS

## 2013-09-25 MED ORDER — MORPHINE SULFATE (PF) 1 MG/ML IV SOLN
INTRAVENOUS | Status: DC
  Administered 2013-09-25: 23.26 mg via INTRAVENOUS
  Administered 2013-09-25: 12:00:00 via INTRAVENOUS
  Administered 2013-09-25: 1.5 mg via INTRAVENOUS
  Administered 2013-09-26: 20:00:00 via INTRAVENOUS
  Administered 2013-09-26: 7.5 mg via INTRAVENOUS
  Administered 2013-09-26: 15.99 mg via INTRAVENOUS
  Administered 2013-09-26: 23.49 mg via INTRAVENOUS
  Administered 2013-09-26: 13:00:00 via INTRAVENOUS
  Administered 2013-09-26: 7.5 mg via INTRAVENOUS
  Administered 2013-09-26: 13.5 mg via INTRAVENOUS
  Administered 2013-09-27: 6.98 mg via INTRAVENOUS
  Administered 2013-09-27: 12 mg via INTRAVENOUS
  Administered 2013-09-27: 7.5 mg via INTRAVENOUS
  Administered 2013-09-27: 10:00:00 via INTRAVENOUS
  Administered 2013-09-27: 13.3 mg via INTRAVENOUS
  Administered 2013-09-27: 4.5 mg via INTRAVENOUS
  Administered 2013-09-27: 10.5 mg via INTRAVENOUS
  Administered 2013-09-28: 6 mg via INTRAVENOUS
  Administered 2013-09-28: 3.91 mg via INTRAVENOUS
  Filled 2013-09-25 (×7): qty 25

## 2013-09-25 MED ORDER — HYDROMORPHONE HCL PF 1 MG/ML IJ SOLN
INTRAMUSCULAR | Status: DC | PRN
  Administered 2013-09-25 (×2): 0.5 mg via INTRAVENOUS
  Administered 2013-09-25: 1 mg via INTRAVENOUS

## 2013-09-25 MED ORDER — LACTATED RINGERS IV SOLN: INTRAVENOUS | Status: DC

## 2013-09-25 MED ORDER — HYDROMORPHONE HCL PF 2 MG/ML IJ SOLN
INTRAMUSCULAR | Status: AC
  Filled 2013-09-25: qty 1

## 2013-09-25 MED ORDER — TAMSULOSIN HCL 0.4 MG PO CAPS
0.4000 mg | ORAL_CAPSULE | Freq: Every day | ORAL | Status: DC
  Administered 2013-09-26 – 2013-09-29 (×4): 0.4 mg via ORAL
  Filled 2013-09-25 (×5): qty 1

## 2013-09-25 MED ORDER — SIMVASTATIN 20 MG PO TABS
20.0000 mg | ORAL_TABLET | Freq: Every day | ORAL | Status: DC
  Administered 2013-09-26 – 2013-09-28 (×3): 20 mg via ORAL
  Filled 2013-09-25 (×6): qty 1

## 2013-09-25 MED ORDER — DEXAMETHASONE SODIUM PHOSPHATE 10 MG/ML IJ SOLN
INTRAMUSCULAR | Status: AC
  Filled 2013-09-25: qty 1

## 2013-09-25 MED ORDER — FENTANYL CITRATE 0.05 MG/ML IJ SOLN
INTRAMUSCULAR | Status: DC | PRN
  Administered 2013-09-25: 100 ug via INTRAVENOUS
  Administered 2013-09-25 (×2): 50 ug via INTRAVENOUS
  Administered 2013-09-25: 100 ug via INTRAVENOUS
  Administered 2013-09-25: 50 ug via INTRAVENOUS

## 2013-09-25 SURGICAL SUPPLY — 84 items
APPLIER CLIP 5 13 M/L LIGAMAX5 (MISCELLANEOUS)
APR CLP MED LRG 5 ANG JAW (MISCELLANEOUS)
BLADE EXTENDED COATED 6.5IN (ELECTRODE) ×3 IMPLANT
BLADE HEX COATED 2.75 (ELECTRODE) ×4 IMPLANT
BLADE SURG SZ10 CARB STEEL (BLADE) ×3 IMPLANT
CABLE HIGH FREQUENCY MONO STRZ (ELECTRODE) ×2 IMPLANT
CANISTER SUCTION 2500CC (MISCELLANEOUS) ×1 IMPLANT
CELLS DAT CNTRL 66122 CELL SVR (MISCELLANEOUS) IMPLANT
CHLORAPREP W/TINT 26ML (MISCELLANEOUS) ×3 IMPLANT
CLIP APPLIE 5 13 M/L LIGAMAX5 (MISCELLANEOUS) IMPLANT
COVER MAYO STAND STRL (DRAPES) ×4 IMPLANT
DECANTER SPIKE VIAL GLASS SM (MISCELLANEOUS) ×3 IMPLANT
DRAIN CHANNEL 19F RND (DRAIN) IMPLANT
DRAPE LAPAROSCOPIC ABDOMINAL (DRAPES) ×1 IMPLANT
DRAPE LG THREE QUARTER DISP (DRAPES) ×5 IMPLANT
DRAPE UTILITY XL STRL (DRAPES) ×6 IMPLANT
DRAPE WARM FLUID 44X44 (DRAPE) ×3 IMPLANT
DRSG OPSITE POSTOP 4X10 (GAUZE/BANDAGES/DRESSINGS) ×3 IMPLANT
DRSG OPSITE POSTOP 4X6 (GAUZE/BANDAGES/DRESSINGS) IMPLANT
DRSG OPSITE POSTOP 4X8 (GAUZE/BANDAGES/DRESSINGS) ×2 IMPLANT
ELECT REM PT RETURN 9FT ADLT (ELECTROSURGICAL) ×3
ELECTRODE REM PT RTRN 9FT ADLT (ELECTROSURGICAL) ×1 IMPLANT
EVACUATOR SILICONE 100CC (DRAIN) IMPLANT
GLOVE BIO SURGEON STRL SZ 6.5 (GLOVE) ×4 IMPLANT
GLOVE BIO SURGEONS STRL SZ 6.5 (GLOVE) ×2
GLOVE BIOGEL PI IND STRL 7.0 (GLOVE) ×2 IMPLANT
GLOVE BIOGEL PI INDICATOR 7.0 (GLOVE) ×12
GOWN STRL REIN 2XL XLG LVL4 (GOWN DISPOSABLE) ×6 IMPLANT
GOWN STRL REUS W/TWL XL LVL3 (GOWN DISPOSABLE) ×16 IMPLANT
KIT BASIN OR (CUSTOM PROCEDURE TRAY) ×3 IMPLANT
LEGGING LITHOTOMY PAIR STRL (DRAPES) ×2 IMPLANT
LIGASURE IMPACT 36 18CM CVD LR (INSTRUMENTS) IMPLANT
MANIFOLD NEPTUNE II (INSTRUMENTS) ×4 IMPLANT
NS IRRIG 1000ML POUR BTL (IV SOLUTION) ×9 IMPLANT
PACK GENERAL/GYN (CUSTOM PROCEDURE TRAY) ×1 IMPLANT
PENCIL BUTTON HOLSTER BLD 10FT (ELECTRODE) ×3 IMPLANT
RETRACTOR WND ALEXIS 18 MED (MISCELLANEOUS) IMPLANT
RTRCTR WOUND ALEXIS 18CM MED (MISCELLANEOUS)
SCISSORS LAP 5X35 DISP (ENDOMECHANICALS) ×3 IMPLANT
SEALER TISSUE G2 CVD JAW 35 (ENDOMECHANICALS) IMPLANT
SEALER TISSUE G2 CVD JAW 45CM (ENDOMECHANICALS) ×2
SEALER TISSUE G2 STRG ARTC 35C (ENDOMECHANICALS) IMPLANT
SET IRRIG TUBING LAPAROSCOPIC (IRRIGATION / IRRIGATOR) ×3 IMPLANT
SLEEVE SURGEON STRL (DRAPES) ×2 IMPLANT
SLEEVE XCEL OPT CAN 5 100 (ENDOMECHANICALS) ×9 IMPLANT
SOLUTION ANTI FOG 6CC (MISCELLANEOUS) ×3 IMPLANT
SPONGE GAUZE 4X4 12PLY (GAUZE/BANDAGES/DRESSINGS) ×1 IMPLANT
SPONGE LAP 18X18 X RAY DECT (DISPOSABLE) ×4 IMPLANT
STAPLER CIRC CVD 29MM 37CM (STAPLE) ×2 IMPLANT
STAPLER CUT RELOAD BLUE (STAPLE) ×4 IMPLANT
STAPLER VISISTAT 35W (STAPLE) ×2 IMPLANT
SUCTION POOLE TIP (SUCTIONS) ×3 IMPLANT
SUT ETHILON 2 0 PS N (SUTURE) IMPLANT
SUT NOVA 1 T20/GS 25DT (SUTURE) ×6 IMPLANT
SUT NOVA NAB DX-16 0-1 5-0 T12 (SUTURE) ×3 IMPLANT
SUT PDS AB 1 CTX 36 (SUTURE) IMPLANT
SUT PDS AB 1 TP1 96 (SUTURE) IMPLANT
SUT PROLENE 2 0 KS (SUTURE) ×2 IMPLANT
SUT SILK 2 0 (SUTURE) ×3
SUT SILK 2 0 KS (SUTURE) ×2 IMPLANT
SUT SILK 2 0 SH CR/8 (SUTURE) ×6 IMPLANT
SUT SILK 2-0 18XBRD TIE 12 (SUTURE) ×1 IMPLANT
SUT SILK 3 0 (SUTURE) ×3
SUT SILK 3 0 SH CR/8 (SUTURE) ×3 IMPLANT
SUT SILK 3-0 18XBRD TIE 12 (SUTURE) ×1 IMPLANT
SUT VIC AB 2-0 SH 18 (SUTURE) ×2 IMPLANT
SUT VIC AB 4-0 PS2 18 (SUTURE) ×2 IMPLANT
SUT VIC AB 4-0 PS2 27 (SUTURE) ×3 IMPLANT
SUT VICRYL 2 0 18  UND BR (SUTURE)
SUT VICRYL 2 0 18 UND BR (SUTURE) IMPLANT
SYR BULB IRRIGATION 50ML (SYRINGE) IMPLANT
SYS LAPSCP GELPORT 120MM (MISCELLANEOUS) ×3
SYSTEM LAPSCP GELPORT 120MM (MISCELLANEOUS) IMPLANT
TOWEL OR 17X26 10 PK STRL BLUE (TOWEL DISPOSABLE) ×6 IMPLANT
TOWEL OR NON WOVEN STRL DISP B (DISPOSABLE) ×8 IMPLANT
TRAY FOLEY CATH 14FRSI W/METER (CATHETERS) ×3 IMPLANT
TRAY LAP CHOLE (CUSTOM PROCEDURE TRAY) ×3 IMPLANT
TROCAR BLADELESS OPT 5 100 (ENDOMECHANICALS) ×3 IMPLANT
TROCAR XCEL 12X100 BLDLESS (ENDOMECHANICALS) IMPLANT
TROCAR XCEL BLUNT TIP 100MML (ENDOMECHANICALS) ×3 IMPLANT
TROCAR XCEL NON-BLD 11X100MML (ENDOMECHANICALS) IMPLANT
TUBING FILTER THERMOFLATOR (ELECTROSURGICAL) ×3 IMPLANT
TUBING INSUFFLATION 10FT LAP (TUBING) ×3 IMPLANT
YANKAUER SUCT BULB TIP 10FT TU (MISCELLANEOUS) ×3 IMPLANT

## 2013-09-25 NOTE — Progress Notes (Signed)
Inpatient Diabetes Program Recommendations  AACE/ADA: New Consensus Statement on Inpatient Glycemic Control (2013)  Target Ranges:  Prepandial:   less than 140 mg/dL      Peak postprandial:   less than 180 mg/dL (1-2 hours)      Critically ill patients:  140 - 180 mg/dL   Reason for Visit: Hyperglycemia  Diabetes history: DM2 Outpatient Diabetes medications: Lantus 75 units QHS, metformin 1000 mg bid and glipizide 10 mg bid Current orders for Inpatient glycemic control: Lantus 75 units QHS (to start 5/22), metformin 1000 mg bid, glipizide 10 mg bid (to start 5/23)  To prevent hypoglycemia, start 1/2 home dose Lantus tonight.  Inpatient Diabetes Program Recommendations Insulin - Basal: Decrease Lantus to 1/2 home basal insulin - Lantus 36 units QHS Correction (SSI): Add Novolog resistant Q4H Oral Agents: D/C OHAs while inpatient. Restart metformin and glipizide at discharge HgbA1C: HgbA1C to assess glycemic control prior to hospitalization Diet: When advanced, CHO mod med  Note: Will follow. Thank you. Lorenda Peck, RD, LDN, CDE Inpatient Diabetes Coordinator [phone]

## 2013-09-25 NOTE — Op Note (Signed)
09/25/2013  11:25 AM  PATIENT:  Robert Moore  53 y.o. male  Patient Care Team: Amador Cunas, FNP as PCP - General (Family Medicine) Primus Bravo, MD as Referring Physician (Urology) Leighton Ruff, MD as Surgeon (General Surgery)  PRE-OPERATIVE DIAGNOSIS:  colon cancer   POST-OPERATIVE DIAGNOSIS:  colon cancer   PROCEDURE:  LAPAROSCOPIC LEFT COLECTOMY WITH SPLENIC FLEXURE MOBILIZATION  SURGEON:  Surgeon(s): Zenovia Jarred, MD Leighton Ruff, MD  ASSISTANT: Grandville Silos   ANESTHESIA:   local and general  EBL:  Total I/O In: 1000 [I.V.:1000] Out: 175 [Urine:100; Blood:75]  Delay start of Pharmacological VTE agent (>24hrs) due to surgical blood loss or risk of bleeding:  no  DRAINS: none   SPECIMEN:  Source of Specimen:  L colon (Tattoo marks proximal end)  DISPOSITION OF SPECIMEN:  PATHOLOGY  COUNTS:  YES  PLAN OF CARE: Admit to inpatient   PATIENT DISPOSITION:  PACU - hemodynamically stable.  INDICATION:    This is a 53 y.o. M who was found to have a colon cancer on screening colonoscopy.  Metastatic work up was negative.  I recommended segmental resection.  The anatomy & physiology of the digestive tract was discussed.  The pathophysiology was discussed.  Natural history risks without surgery was discussed.   I worked to give an overview of the disease and the frequent need to have multispecialty involvement.  I feel the risks of no intervention will lead to serious problems that outweigh the operative risks; therefore, I recommended a partial colectomy to remove the pathology.  Laparoscopic & open techniques were discussed.   Risks such as bleeding, infection, abscess, leak, reoperation, possible ostomy, hernia, heart attack, death, and other risks were discussed.  I noted a good likelihood this will help address the problem.   Goals of post-operative recovery were discussed as well.    The patient expressed understanding & wished to proceed with surgery.  OR  FINDINGS:   Patient had a proximal descending colon lesion.  No obvious metastatic disease on visceral parietal peritoneum or liver.  The anastomosis rests ~15 cm from the anal verge.  DESCRIPTION:   Informed consent was confirmed.  The patient underwent general anaesthesia without difficulty.  The patient was positioned appropriately.  VTE prevention in place.  The patient's abdomen was clipped, prepped, & draped in a sterile fashion.  Surgical timeout confirmed our plan.  The patient was positioned in reverse Trendelenburg.  Abdominal entry was gained using open technique.  Entry was clean.  I induced carbon dioxide insufflation.  Camera inspection revealed no injury.  Extra ports were carefully placed under direct laparoscopic visualization.   I reflected the greater omentum and the upper abdomen the small bowel in the upper abdomen. I scored the base of peritoneum of the right side of the mesentery of the left colon from the ligament of Treitz to the peritoneal reflection of the mid rectum.  The patient had a marked lesion in the descending colon.  I elevated the sigmoid mesentery and enetered into the retro-mesenteric plane. We were able to identify the left ureter and gonadal vessels. We kept those posterior within the retroperitoneum and elevated the left colon mesentery off that. I did isolated IMA pedicle but did not ligate it yet.  I continued distally and got into the avascular plane posterior to the mesorectum. This allowed me to help mobilize the rectum as well by freeing the mesorectum off the sacrum.  I mobilized the peritoneal coverings towards the peritoneal reflection  on both the right and left sides of the rectum.  I could see the right and left ureters and stayed away from them.    I skeletonized the inferior mesenteric artery pedicle.  I went down to its takeoff from the aorta.   I isolated the inferior mesenteric vein off of the ligament of Treitz just cephalad to that as well.   After confirming the left ureter was out of the way, I went ahead and ligated the inferior mesenteric artery pedicle with bipolar EnSeal ~2cm above its takeoff from the aorta.   I did ligate the inferior mesenteric vein in a similar fashion.  We ensured hemostasis. I skeletonized the mesorectum at the junction at the proximal rectum using blunt dissection & bipolar EnSeal.  I mobilized the left colon in a lateral to medial fashion off the line of Toldt up towards the splenic flexure to ensure good mobilization of the left colon to reach into the pelvis.  This was done using the Enseal device.  The mesentery and gastrocolic ligament were divided to the middle of the transverse colon.  The colon was then able to be mobilized to the pelvis.  I made a Pfannenstiel incision approximately 2 cm above the pubic symphysis.  I then transected the colon ~3-4 cm proximal to the mass with a Contour stapler.  There was good bleeding noted at transection.  The remaining mesentery was divided using Enseal.  The rectosigmoid was divided, also using a contour stapler blue load.  The specimen was sent to pathology for further examination.    I removed the proximal staple line using electrocautery. The EEA sizers were used to determine the correct stapler.  A 29 mm EEA stapler was selected. A pursestring device was placed on the proximal open colon. A 2-0 Prolene suture was used to create the pursestring. This was secured using 3-0 silk sutures. After this an EEA stapler was placed through the anal canal up to the rectal stump. The spike was brought out into the rectum just anterior to the staple line. The anastomosis was created without tension. There is no sutures were used to oversew the anterior portion of the staple line. The abdomen was irrigated using normal saline. Hemostasis was good.  The omentum was brought out into the pelvis. The laparoscopic ports and the wound protector were removed. We switched to clean gowns, drapes  and equipment. The peritoneum was then closed using running 2-0 Vicryl suture.  The fascia was closed using number 0 Novafil sutures in a running fashion. The subcutaneous tissue was closed using interrupted 2-0 Vicryl sutures. The umbilical incision fascia was closed using 2 interrupted 0 Vicryl sutures. The skin of all incisions was closed using 4-0 Vicryl subcuticular sutures. Dermabond was used to close the port sites. A sterile dressing was placed over the Pfannenstiel incision. All counts were correct per operating room staff. The patient was awakened from anesthesia and sent to the post anesthesia care in stable condition.

## 2013-09-25 NOTE — Anesthesia Postprocedure Evaluation (Signed)
  Anesthesia Post-op Note  Patient: Robert Moore  Procedure(s) Performed: Procedure(s) (LRB): LAPAROSCOPIC PARTIAL COLECTOMY (N/A)  Patient Location: PACU  Anesthesia Type: General  Level of Consciousness: awake and alert   Airway and Oxygen Therapy: Patient Spontanous Breathing  Post-op Pain: mild  Post-op Assessment: Post-op Vital signs reviewed, Patient's Cardiovascular Status Stable, Respiratory Function Stable, Patent Airway and No signs of Nausea or vomiting  Last Vitals:  Filed Vitals:   09/25/13 1215  BP: 149/81  Pulse: 95  Temp:   Resp: 20    Post-op Vital Signs: stable   Complications: No apparent anesthesia complications

## 2013-09-25 NOTE — Plan of Care (Signed)
Problem: Consults Goal: General Surgical Patient Education (See Patient Education module for education specifics) Outcome: Progressing General education provided to wife, family.   Problem: Phase I Progression Outcomes Goal: Pain controlled with appropriate interventions Outcome: Progressing Pt on PCA. Pt and family educated on PCA use. Pt will need reinforcement of education due to drowsiness.

## 2013-09-25 NOTE — Transfer of Care (Signed)
Immediate Anesthesia Transfer of Care Note  Patient: Robert Moore  Procedure(s) Performed: Procedure(s): LAPAROSCOPIC PARTIAL COLECTOMY (N/A)  Patient Location: PACU  Anesthesia Type:General  Level of Consciousness: awake and alert   Airway & Oxygen Therapy: Patient Spontanous Breathing and Patient connected to face mask oxygen  Post-op Assessment: Report given to PACU RN and Post -op Vital signs reviewed and stable  Post vital signs: Reviewed and stable  Complications: No apparent anesthesia complications

## 2013-09-25 NOTE — H&P (Signed)
Chief Complaint   Patient presents with   .  New Evaluation     eval desending colon CA   HISTORY:  Robert Moore is a 53 y.o. male who presents to the office with colon cancer. This was found on screeing colonoscopy. The polyp was resected piecemeal from the proximal descending colon. Biopsy shows adenocarcinoma extending to the base or the resected margin on several specimens. Workup thus far has included CT abd, which was neg for any metastatic diease, and a CEA, which was normal. Pt denies any rectal bleeding or changes in bowel habits or weight loss.  Past Medical History   Diagnosis  Date   .  Cancer    .  Diabetes mellitus without complication    .  Hypertension    .  Hyperlipidemia    .  Heart murmur     Past Surgical History   Procedure  Laterality  Date   .  Ear tuck   1975   .  Bone spur  Right  2000     foot    Current Outpatient Prescriptions   Medication  Sig  Dispense  Refill   .  metroNIDAZOLE (FLAGYL) 500 MG tablet  Take 1 tablet (500 mg total) by mouth as directed.  3 tablet  0    No current facility-administered medications for this visit.   No Known Allergies  History reviewed. No pertinent family history.  History    Social History   .  Marital Status:  Married     Spouse Name:  N/A     Number of Children:  N/A   .  Years of Education:  N/A    Social History Main Topics   .  Smoking status:  Never Smoker   .  Smokeless tobacco:  Never Used   .  Alcohol Use:  Yes      Comment: occ   .  Drug Use:  No   .  Sexual Activity:  None    Other Topics  Concern   .  None    Social History Narrative   .  None   REVIEW OF SYSTEMS - PERTINENT POSITIVES ONLY:  Review of Systems - General ROS: negative for - chills or fever  Respiratory ROS: no cough, shortness of breath, or wheezing  Cardiovascular ROS: no chest pain or dyspnea on exertion  Gastrointestinal ROS: no abdominal pain, change in bowel habits, or black or bloody stools  Genito-Urinary ROS: no  dysuria, trouble voiding, or hematuria  EXAM:  Filed Vitals:   09/25/13 0527  BP: 128/76  Pulse: 79  Temp: 97.6 F (36.4 C)  Resp: 18   Gen: No acute distress. Well nourished and well groomed.  Neurological: Alert and oriented to person, place, and time. Coordination normal.  Head: Normocephalic and atraumatic.  Eyes: Conjunctivae are normal. Pupils are equal, round, and reactive to light. No scleral icterus.  Neck: Normal range of motion. Neck supple. No tracheal deviation or thyromegaly present. No cervical lymphadenopathy.  Cardiovascular: Normal rate, regular rhythm, normal heart sounds and intact distal pulses.  Respiratory: Effort normal. No respiratory distress. No chest wall tenderness. Breath sounds normal. No wheezes, rales or rhonchi.  GI: Soft. Bowel sounds are normal. The abdomen is soft and nontender. There is no rebound and no guarding. Umbilical hernia present Musculoskeletal: Normal range of motion. Extremities are nontender.  Skin: Skin is warm and dry. No rash noted. No diaphoresis. No erythema. No pallor. No clubbing, cyanosis,  or edema.  Psychiatric: Normal mood and affect. Behavior is normal. Judgment and thought content normal.  LABORATORY RESULTS:  Available labs are reviewed  CEA: 1.9  WBC 9.7  Hgb: 16.1  RADIOLOGY RESULTS:  Images and reports are reviewed.  CT abd report reviewed. No signs of metastatic disease. Small liver masses: "probable cysts", mass at base of bladder  ASSESSMENT AND PLAN:  Robert Moore is a 53 y.o. M with a colon cancer found on screening colonoscopy. This was tattooed and found in the proximal descending colon. His CT C/A/P show no signs of metastatic disease. He has been seen by Dr Felipa Eth for this bladder mass, and cystoscopy did not reveal any mass per patient. I will obtain these records as well.  Most likely it will be Laparoscopic L colectomy. We discussed the surgery in detail. We discussed alternatives, which include repeat  colonoscopy and biopsies with close endoscopic surveillance. He has elected to proceed with surgery instead. We discussed the possibility of finding nothing at all or a deeper cancer.  The surgery and anatomy were described to the patient as well as the risks of surgery and the possible complications. These include: Bleeding, infection and possible wound complications such as hernia, damage to adjacent structures, leak of surgical connections, which can lead to other surgeries and possibly an ostomy (5-7%), possible need for other procedures, such as abscess drains in radiology, possible prolonged hospital stay, possible diarrhea from removal of part of the colon, possible constipation from narcotics, prolonged fatigue/weakness or appetite loss, possible early recurrence of cancer, possible complications of their medical problems such as heart disease or arrhythmias or lung problems, death (less than 1%). I believe the patient understands and wishes to proceed with the surgery.  Rosario Adie, MD  Colon and Rectal Surgery / [facility]  Visit Diagnoses:  1.  Colon cancer

## 2013-09-26 ENCOUNTER — Encounter (HOSPITAL_COMMUNITY): Payer: Self-pay | Admitting: General Surgery

## 2013-09-26 LAB — GLUCOSE, CAPILLARY
GLUCOSE-CAPILLARY: 139 mg/dL — AB (ref 70–99)
GLUCOSE-CAPILLARY: 168 mg/dL — AB (ref 70–99)
GLUCOSE-CAPILLARY: 203 mg/dL — AB (ref 70–99)
GLUCOSE-CAPILLARY: 224 mg/dL — AB (ref 70–99)
Glucose-Capillary: 152 mg/dL — ABNORMAL HIGH (ref 70–99)
Glucose-Capillary: 182 mg/dL — ABNORMAL HIGH (ref 70–99)
Glucose-Capillary: 176 mg/dL — ABNORMAL HIGH (ref 70–99)
Glucose-Capillary: 219 mg/dL — ABNORMAL HIGH (ref 70–99)

## 2013-09-26 LAB — BASIC METABOLIC PANEL
BUN: 15 mg/dL (ref 6–23)
CALCIUM: 9.1 mg/dL (ref 8.4–10.5)
CO2: 24 mEq/L (ref 19–32)
Chloride: 99 mEq/L (ref 96–112)
Creatinine, Ser: 0.98 mg/dL (ref 0.50–1.35)
Glucose, Bld: 153 mg/dL — ABNORMAL HIGH (ref 70–99)
Potassium: 4 mEq/L (ref 3.7–5.3)
Sodium: 136 mEq/L — ABNORMAL LOW (ref 137–147)

## 2013-09-26 LAB — CBC
HCT: 40.7 % (ref 39.0–52.0)
Hemoglobin: 14.1 g/dL (ref 13.0–17.0)
MCH: 30.9 pg (ref 26.0–34.0)
MCHC: 34.6 g/dL (ref 30.0–36.0)
MCV: 89.3 fL (ref 78.0–100.0)
PLATELETS: 205 10*3/uL (ref 150–400)
RBC: 4.56 MIL/uL (ref 4.22–5.81)
RDW: 13.3 % (ref 11.5–15.5)
WBC: 12.5 10*3/uL — AB (ref 4.0–10.5)

## 2013-09-26 MED ORDER — PANTOPRAZOLE SODIUM 40 MG PO TBEC
40.0000 mg | DELAYED_RELEASE_TABLET | Freq: Every day | ORAL | Status: DC
Start: 2013-09-26 — End: 2013-09-29
  Administered 2013-09-26 – 2013-09-29 (×4): 40 mg via ORAL
  Filled 2013-09-26 (×4): qty 1

## 2013-09-26 NOTE — Care Management Note (Signed)
    Page 1 of 1   09/26/2013     12:25:06 PM CARE MANAGEMENT NOTE 09/26/2013  Patient:  Robert Moore, Robert Moore   Account Number:  [id number]  Date Initiated:  09/26/2013  Documentation initiated by:  Sunday Spillers  Subjective/Objective Assessment:   53 yo male admitted s/p colectomy. PTA lived at home with spouse.     Action/Plan:   Home when stable   Anticipated DC Date:  09/26/2013   Anticipated DC Plan:  [place]  CM consult      Choice offered to / List presented to:             Status of service:  Completed, signed off Medicare Important Message given?   (If response is "NO", the following Medicare IM given date fields will be blank) Date Medicare IM given:   Date Additional Medicare IM given:    Discharge Disposition:  HOME/SELF CARE  Per UR Regulation:  Reviewed for med. necessity/level of care/duration of stay  If discussed at [place] of Stay Meetings, dates discussed:    Comments:

## 2013-09-26 NOTE — Progress Notes (Signed)
1 Day Post-Op Lap L colectomy Subjective: "had a rough night"  Pain controlled now with PCA, denies nausea.  UOP adequate but concentrated.    Objective: Vital signs in last 24 hours: Temp:  [98 F (36.7 C)-99 F (37.2 C)] 98 F (36.7 C) (05/22 0505) Pulse Rate:  [85-107] 85 (05/22 0505) Resp:  [12-20] 16 (05/22 0505) BP: (103-164)/(65-93) 109/70 mmHg (05/22 0505) SpO2:  [92 %-100 %] 99 % (05/22 0505) Weight:  [239 lb (108.41 kg)] 239 lb (108.41 kg) (05/21 1400)   Intake/Output from previous day: 05/21 0701 - 05/22 0700 In: 3375 [I.V.:3375] Out: 1025 [Urine:950; Blood:75] Intake/Output this shift:  General appearance: alert and cooperative GI: normal findings: soft, mildly distended, appropriately tender  Incision: slight bloody drainage on dressing  Lab Results:   Recent Labs  09/26/13 0450  WBC 12.5*  HGB 14.1  HCT 40.7  PLT 205   BMET  Recent Labs  09/26/13 0450  NA 136*  K 4.0  CL 99  CO2 24  GLUCOSE 153*  BUN 15  CREATININE 0.98  CALCIUM 9.1   PT/INR No results found for this basename: LABPROT, INR,  in the last 72 hours ABG No results found for this basename: PHART, PCO2, PO2, HCO3,  in the last 72 hours  MEDS, Scheduled . acetaminophen  1,000 mg Oral Q6H  . alvimopan  12 mg Oral BID  . antiseptic oral rinse  15 mL Mouth Rinse BID  . buPROPion  300 mg Oral q morning - 10a  . enoxaparin (LOVENOX) injection  40 mg Subcutaneous Q24H  . glipiZIDE  10 mg Oral BID AC  . lisinopril  20 mg Oral Daily   And  . hydrochlorothiazide  12.5 mg Oral Daily  . insulin aspart  0-20 Units Subcutaneous 6 times per day  . [START ON 09/27/2013] metFORMIN  1,000 mg Oral BID WC  . morphine   Intravenous 6 times per day  . simvastatin  20 mg Oral q1800  . tamsulosin  0.4 mg Oral QPC breakfast    Studies/Results: No results found.  Assessment: s/p Procedure(s): LAPAROSCOPIC PARTIAL COLECTOMY Patient Active Problem List   Diagnosis Date Noted  . Colon  cancer 08/25/2013    Expected post op course  Plan: Advance diet to clears COnt IVF's Cont PCA Ambulate Will d/c foley in AM.  Fluids can likely be decreased at that time as well.     LOS: 1 day     .Rosario Adie, [facility] [place]   09/26/2013 8:29 AM

## 2013-09-27 LAB — CBC
HCT: 34.5 % — ABNORMAL LOW (ref 39.0–52.0)
Hemoglobin: 11.5 g/dL — ABNORMAL LOW (ref 13.0–17.0)
MCH: 30.2 pg (ref 26.0–34.0)
MCHC: 33.3 g/dL (ref 30.0–36.0)
MCV: 90.6 fL (ref 78.0–100.0)
Platelets: 147 K/uL — ABNORMAL LOW (ref 150–400)
RBC: 3.81 MIL/uL — ABNORMAL LOW (ref 4.22–5.81)
RDW: 13.3 % (ref 11.5–15.5)
WBC: 9.9 K/uL (ref 4.0–10.5)

## 2013-09-27 LAB — BASIC METABOLIC PANEL
BUN: 9 mg/dL (ref 6–23)
CHLORIDE: 98 meq/L (ref 96–112)
CO2: 27 mEq/L (ref 19–32)
Calcium: 8.3 mg/dL — ABNORMAL LOW (ref 8.4–10.5)
Creatinine, Ser: 0.92 mg/dL (ref 0.50–1.35)
GFR calc Af Amer: >90 mL/min (ref >90)
GFR calc non Af Amer: >90 mL/min (ref >90)
Glucose, Bld: 189 mg/dL — ABNORMAL HIGH (ref 70–99)
POTASSIUM: 3.7 meq/L (ref 3.7–5.3)
Sodium: 135 mEq/L — ABNORMAL LOW (ref 137–147)

## 2013-09-27 LAB — GLUCOSE, CAPILLARY
GLUCOSE-CAPILLARY: 194 mg/dL — AB (ref 70–99)
GLUCOSE-CAPILLARY: 204 mg/dL — AB (ref 70–99)
Glucose-Capillary: 219 mg/dL — ABNORMAL HIGH (ref 70–99)
Glucose-Capillary: 207 mg/dL — ABNORMAL HIGH (ref 70–99)
Glucose-Capillary: 114 mg/dL — ABNORMAL HIGH (ref 70–99)

## 2013-09-27 NOTE — Progress Notes (Signed)
2 Days Post-Op  Subjective: Complains of some burning pain over lower abdomen. He is having some flatus  Objective: Vital signs in last 24 hours: Temp:  [98 F (36.7 C)-98.1 F (36.7 C)] 98.1 F (36.7 C) (05/23 0529) Pulse Rate:  [84-86] 86 (05/23 0529) Resp:  [13-22] 14 (05/23 0816) BP: (96-121)/(61-77) 111/67 mmHg (05/23 0529) SpO2:  [94 %-100 %] 99 % (05/23 0816) Last BM Date: 09/25/13  Intake/Output from previous day: 05/22 0701 - 05/23 0700 In: 1780 [P.O.:480; I.V.:1300] Out: 3600 [Urine:3600] Intake/Output this shift: Total I/O In: -  Out: 400 [Urine:400]  Resp: clear to auscultation bilaterally Cardio: regular rate and rhythm GI: soft, appropriately tender. good bs. incisions ok  Lab Results:   Recent Labs  09/26/13 0450 09/27/13 0432  WBC 12.5* 9.9  HGB 14.1 11.5*  HCT 40.7 34.5*  PLT 205 147*   BMET  Recent Labs  09/26/13 0450 09/27/13 0432  NA 136* 135*  K 4.0 3.7  CL 99 98  CO2 24 27  GLUCOSE 153* 189*  BUN 15 9  CREATININE 0.98 0.92  CALCIUM 9.1 8.3*   PT/INR No results found for this basename: LABPROT, INR,  in the last 72 hours ABG No results found for this basename: PHART, PCO2, PO2, HCO3,  in the last 72 hours  Studies/Results: No results found.  Anti-infectives: Anti-infectives   Start     Dose/Rate Route Frequency Ordered Stop   09/25/13 1800  cefoTEtan (CEFOTAN) 2 g in dextrose 5 % 50 mL IVPB     2 g 100 mL/hr over 30 Minutes Intravenous Every 12 hours 09/25/13 1324 09/25/13 1930   09/25/13 0528  cefoTEtan (CEFOTAN) 2 g in dextrose 5 % 50 mL IVPB     2 g 100 mL/hr over 30 Minutes Intravenous On call to O.R. 09/25/13 3570 09/25/13 0745      Assessment/Plan: s/p Procedure(s): LAPAROSCOPIC PARTIAL COLECTOMY (N/A) Advance diet. Start fulls today ambulate  LOS: 2 days    Robert Moore 09/27/2013

## 2013-09-28 LAB — GLUCOSE, CAPILLARY
GLUCOSE-CAPILLARY: 198 mg/dL — AB (ref 70–99)
GLUCOSE-CAPILLARY: 245 mg/dL — AB (ref 70–99)
Glucose-Capillary: 225 mg/dL — ABNORMAL HIGH (ref 70–99)
Glucose-Capillary: 166 mg/dL — ABNORMAL HIGH (ref 70–99)
Glucose-Capillary: 171 mg/dL — ABNORMAL HIGH (ref 70–99)
Glucose-Capillary: 143 mg/dL — ABNORMAL HIGH (ref 70–99)

## 2013-09-28 LAB — CBC
HCT: 34 % — ABNORMAL LOW (ref 39.0–52.0)
Hemoglobin: 11.6 g/dL — ABNORMAL LOW (ref 13.0–17.0)
MCH: 30.9 pg (ref 26.0–34.0)
MCHC: 34.1 g/dL (ref 30.0–36.0)
MCV: 90.4 fL (ref 78.0–100.0)
Platelets: 175 10*3/uL (ref 150–400)
RBC: 3.76 MIL/uL — ABNORMAL LOW (ref 4.22–5.81)
RDW: 13.2 % (ref 11.5–15.5)
WBC: 7.4 10*3/uL (ref 4.0–10.5)

## 2013-09-28 LAB — BASIC METABOLIC PANEL
BUN: 7 mg/dL (ref 6–23)
CALCIUM: 8.6 mg/dL (ref 8.4–10.5)
CO2: 28 mEq/L (ref 19–32)
Chloride: 99 mEq/L (ref 96–112)
Creatinine, Ser: 0.85 mg/dL (ref 0.50–1.35)
Glucose, Bld: 222 mg/dL — ABNORMAL HIGH (ref 70–99)
Potassium: 4 mEq/L (ref 3.7–5.3)
Sodium: 137 mEq/L (ref 137–147)

## 2013-09-28 MED ORDER — OXYCODONE-ACETAMINOPHEN 5-325 MG PO TABS
1.0000 | ORAL_TABLET | ORAL | Status: DC | PRN
  Administered 2013-09-28 – 2013-09-29 (×3): 2 via ORAL
  Filled 2013-09-28 (×3): qty 2

## 2013-09-28 MED ORDER — INSULIN ASPART 100 UNIT/ML ~~LOC~~ SOLN
0.0000 [IU] | Freq: Three times a day (TID) | SUBCUTANEOUS | Status: DC
Start: 2013-09-29 — End: 2013-09-29

## 2013-09-28 MED ORDER — MENTHOL 3 MG MT LOZG
1.0000 | LOZENGE | OROMUCOSAL | Status: DC | PRN
  Filled 2013-09-28: qty 9

## 2013-09-28 NOTE — Progress Notes (Signed)
3 Days Post-Op  Subjective: Feels better today. Had 2 bm's overnight  Objective: Vital signs in last 24 hours: Temp:  [98.2 F (36.8 C)-98.8 F (37.1 C)] 98.2 F (36.8 C) (05/24 0645) Pulse Rate:  [60-101] 80 (05/24 0645) Resp:  [13-21] 16 (05/24 0804) BP: (101-114)/(62-71) 114/69 mmHg (05/24 0645) SpO2:  [98 %-100 %] 100 % (05/24 0804) Last BM Date: 09/27/13  Intake/Output from previous day: 05/23 0701 - 05/24 0700 In: 2880 [P.O.:480; I.V.:2400] Out: 3000 [Urine:3000] Intake/Output this shift:    Resp: clear to auscultation bilaterally Cardio: regular rate and rhythm GI: soft, mild tenderness. good bs. incision ok  Lab Results:   Recent Labs  09/27/13 0432 09/28/13 0435  WBC 9.9 7.4  HGB 11.5* 11.6*  HCT 34.5* 34.0*  PLT 147* 175   BMET  Recent Labs  09/27/13 0432 09/28/13 0435  NA 135* 137  K 3.7 4.0  CL 98 99  CO2 27 28  GLUCOSE 189* 222*  BUN 9 7  CREATININE 0.92 0.85  CALCIUM 8.3* 8.6   PT/INR No results found for this basename: LABPROT, INR,  in the last 72 hours ABG No results found for this basename: PHART, PCO2, PO2, HCO3,  in the last 72 hours  Studies/Results: No results found.  Anti-infectives: Anti-infectives   Start     Dose/Rate Route Frequency Ordered Stop   09/25/13 1800  cefoTEtan (CEFOTAN) 2 g in dextrose 5 % 50 mL IVPB     2 g 100 mL/hr over 30 Minutes Intravenous Every 12 hours 09/25/13 1324 09/25/13 1930   09/25/13 0528  cefoTEtan (CEFOTAN) 2 g in dextrose 5 % 50 mL IVPB     2 g 100 mL/hr over 30 Minutes Intravenous On call to O.R. 09/25/13 0528 09/25/13 0745      Assessment/Plan: s/p Procedure(s): LAPAROSCOPIC PARTIAL COLECTOMY (N/A) Advance diet to regular D/C pca ambulate  LOS: 3 days    Luella Cook III 09/28/2013

## 2013-09-29 LAB — GLUCOSE, CAPILLARY: Glucose-Capillary: 162 mg/dL — ABNORMAL HIGH (ref 70–99)

## 2013-09-29 MED ORDER — OXYCODONE-ACETAMINOPHEN 5-325 MG PO TABS: 1.0000 | ORAL_TABLET | ORAL | Status: DC | PRN

## 2013-09-29 NOTE — Progress Notes (Signed)
4 Days Post-Op  Subjective: No complaints. He is doing great and wants to go home  Objective: Vital signs in last 24 hours: Temp:  [97.4 F (36.3 C)-97.9 F (36.6 C)] 97.4 F (36.3 C) (05/25 0643) Pulse Rate:  [80-94] 80 (05/25 0643) Resp:  [16-18] 18 (05/25 0643) BP: (107-130)/(65-83) 130/83 mmHg (05/25 0643) SpO2:  [97 %-98 %] 98 % (05/25 0643) Last BM Date: 09/29/13  Intake/Output from previous day: 05/24 0701 - 05/25 0700 In: 1022 [P.O.:480; I.V.:542] Out: 1300 [Urine:1300] Intake/Output this shift: Total I/O In: 240 [P.O.:240] Out: -   Resp: clear to auscultation bilaterally Cardio: regular rate and rhythm GI: soft, nontender. incision ok. + bm  Lab Results:   Recent Labs  09/27/13 0432 09/28/13 0435  WBC 9.9 7.4  HGB 11.5* 11.6*  HCT 34.5* 34.0*  PLT 147* 175   BMET  Recent Labs  09/27/13 0432 09/28/13 0435  NA 135* 137  K 3.7 4.0  CL 98 99  CO2 27 28  GLUCOSE 189* 222*  BUN 9 7  CREATININE 0.92 0.85  CALCIUM 8.3* 8.6   PT/INR No results found for this basename: LABPROT, INR,  in the last 72 hours ABG No results found for this basename: PHART, PCO2, PO2, HCO3,  in the last 72 hours  Studies/Results: No results found.  Anti-infectives: Anti-infectives   Start     Dose/Rate Route Frequency Ordered Stop   09/25/13 1800  cefoTEtan (CEFOTAN) 2 g in dextrose 5 % 50 mL IVPB     2 g 100 mL/hr over 30 Minutes Intravenous Every 12 hours 09/25/13 1324 09/25/13 1930   09/25/13 0528  cefoTEtan (CEFOTAN) 2 g in dextrose 5 % 50 mL IVPB     2 g 100 mL/hr over 30 Minutes Intravenous On call to O.R. 09/25/13 0528 09/25/13 0745      Assessment/Plan: s/p Procedure(s): LAPAROSCOPIC PARTIAL COLECTOMY (N/A) Discharge  LOS: 4 days    Robert Moore 09/29/2013

## 2013-09-30 ENCOUNTER — Encounter (INDEPENDENT_AMBULATORY_CARE_PROVIDER_SITE_OTHER): Payer: Self-pay

## 2013-09-30 ENCOUNTER — Telehealth (INDEPENDENT_AMBULATORY_CARE_PROVIDER_SITE_OTHER): Payer: Self-pay | Admitting: General Surgery

## 2013-09-30 NOTE — Telephone Encounter (Signed)
Called to discuss pathology.  No answer.  Left message.

## 2013-09-30 NOTE — Telephone Encounter (Signed)
Pt called back.  Discussed pathology results.  Pt doing well after surgery

## 2013-10-01 ENCOUNTER — Encounter (INDEPENDENT_AMBULATORY_CARE_PROVIDER_SITE_OTHER): Payer: Self-pay

## 2013-10-02 NOTE — Discharge Summary (Signed)
Physician Discharge Summary  Patient ID: Robert Moore MRN: [id number] DOB/AGE: 1960-10-14 53 y.o.  Admit date: 09/25/2013 Discharge date: 09/29/13  Admission Diagnoses: colon cancer  Discharge Diagnoses:  Active Problems:   Colon cancer   Discharged Condition: good  Hospital Course: Patient was admitted after lap L colectomy for colon cancer.  His diet was advanced to clears on POD 1.  His foley was removed by POD 2.  He began to have bowel function on POD 3 and his diet was advanced to solid foods.  By POD 4 the patient was tolerating a diet, having BM's and his pain was controlled. He was discharged to home.  Consults: None  Significant Diagnostic Studies: labs: cbc, chemistry  Treatments: IV hydration, analgesia: acetaminophen w/ codeine, insulin: regular and surgery: see above  Discharge Exam: Blood pressure 130/83, pulse 80, temperature 97.4 F (36.3 C), temperature source Oral, resp. rate 18, height 6' (1.829 m), weight 239 lb (108.41 kg), SpO2 98.00%. General appearance: alert and cooperative GI: normal findings: soft, non-tender Incision/Wound: clean, dry, intact  Disposition: 01-Home or Self Care  Discharge Instructions   Call MD for:  difficulty breathing, headache or visual disturbances    Complete by:  As directed      Call MD for:  extreme fatigue    Complete by:  As directed      Call MD for:  hives    Complete by:  As directed      Call MD for:  persistant dizziness or light-headedness    Complete by:  As directed      Call MD for:  persistant nausea and vomiting    Complete by:  As directed      Call MD for:  redness, tenderness, or signs of infection (pain, swelling, redness, odor or green/yellow discharge around incision site)    Complete by:  As directed      Call MD for:  severe uncontrolled pain    Complete by:  As directed      Call MD for:  temperature >100.4    Complete by:  As directed      Diet - low sodium heart healthy    Complete by:  As  directed      Discharge instructions    Complete by:  As directed   May shower. Diet as tolerated. Do not lift more than 10lbs     Increase activity slowly    Complete by:  As directed      No wound care    Complete by:  As directed             Medication List         ALPRAZolam 0.25 MG tablet  Commonly known as:  XANAX  Take 0.25 mg by mouth 3 (three) times daily as needed for anxiety.     buPROPion 300 MG 24 hr tablet  Commonly known as:  WELLBUTRIN XL  Take 300 mg by mouth every morning.     glipiZIDE 10 MG tablet  Commonly known as:  GLUCOTROL  Take 10 mg by mouth 2 (two) times daily before a meal.     insulin glargine 100 UNIT/ML injection  Commonly known as:  LANTUS  Inject 75 Units into the skin at bedtime.     lisinopril-hydrochlorothiazide 20-12.5 MG per tablet  Commonly known as:  PRINZIDE,ZESTORETIC  Take 1 tablet by mouth every morning.     lovastatin 40 MG tablet  Commonly known as:  MEVACOR  Take 40 mg by mouth every morning.     metFORMIN 500 MG tablet  Commonly known as:  GLUCOPHAGE  Take 1,000 mg by mouth 2 (two) times daily with a meal.     multivitamin with minerals Tabs tablet  Take 1 tablet by mouth daily.     oxyCODONE-acetaminophen 5-325 MG per tablet  Commonly known as:  PERCOCET/ROXICET  Take 1-2 tablets by mouth every 4 (four) hours as needed for moderate pain.     silodosin 8 MG Caps capsule  Commonly known as:  RAPAFLO  Take 8 mg by mouth daily with breakfast.           Follow-up Information   Follow up with Rosario Adie., MD In 2 weeks.   Specialty:  General Surgery   Contact information:   [address] [place] [address] [phone]       Signed: Leighton Ruff 12/16/313, 11:23 AM

## 2013-10-03 ENCOUNTER — Telehealth (INDEPENDENT_AMBULATORY_CARE_PROVIDER_SITE_OTHER): Payer: Self-pay

## 2013-10-03 NOTE — Telephone Encounter (Signed)
No further instructions.  Thank you

## 2013-10-03 NOTE — Telephone Encounter (Signed)
Patient states he is having panic attacks when he takes his pain medication because he feels as if the medication is getting stuck in his throat .Denies breathing difficulty.   His throat feels swollen. Advised patient to crush his  medication and put in applesauce or pudding , take small bites with big sips of water, he can use the Cepacol spray before taking the medication (given to him at the hospital) and he could try lemon drop  Lozenges  to help sooth his throat . Intubation tube could have caused throat  Irration and  His throat should start to feel better in about a week or two if not to call. I advised him I would inform DR. Thomas for further instructions Kayleen Memos

## 2013-10-10 ENCOUNTER — Encounter (INDEPENDENT_AMBULATORY_CARE_PROVIDER_SITE_OTHER): Payer: Self-pay

## 2013-10-10 ENCOUNTER — Ambulatory Visit (INDEPENDENT_AMBULATORY_CARE_PROVIDER_SITE_OTHER): Payer: PRIVATE HEALTH INSURANCE | Admitting: General Surgery

## 2013-10-10 ENCOUNTER — Encounter (INDEPENDENT_AMBULATORY_CARE_PROVIDER_SITE_OTHER): Payer: Self-pay | Admitting: General Surgery

## 2013-10-10 VITALS — BP 124/82 | HR 97 | Temp 97.7°F | Ht 71.0 in | Wt 225.0 lb

## 2013-10-10 DIAGNOSIS — Z85038 Personal history of other malignant neoplasm of large intestine: Secondary | ICD-10-CM

## 2013-10-10 NOTE — Progress Notes (Signed)
Robert Moore is a 53 y.o. male who is status post a lap L colectomy on 5/21.  He is doing well.  His BM's are regular.  His pain is minimal.  His energy level is slowly returning.    Objective: Filed Vitals:   10/10/13 0957  BP: 124/82  Pulse: 97  Temp: 97.7 F (36.5 C)    General appearance: alert and cooperative GI: normal findings: soft, non-tender  Incision: healing well   Assessment: s/p  Patient Active Problem List   Diagnosis Date Noted  . Colon cancer 08/25/2013   Path shows T2N0 colon cancer, moderately differentiated.  Margin of 2.5 cm at the closest.  No perineural or lymphovascular invasion. Stage 1 disease. Good prognosis.  Baseline CEA: 1.9  Plan: Doing well.  Offered consultation with Oncologist vs continuing to follow with me.  Pt would like to just follow it with me.  Will have pt RTW on July 1.  RTO in 4 mo for recheck CEA and exam.  Will get repeat CT scans and colonoscopy in 1 year.      Rosario Adie, [facility], [place]   10/10/2013 10:14 AM

## 2013-10-15 ENCOUNTER — Encounter (INDEPENDENT_AMBULATORY_CARE_PROVIDER_SITE_OTHER): Payer: Self-pay

## 2014-03-09 ENCOUNTER — Other Ambulatory Visit (INDEPENDENT_AMBULATORY_CARE_PROVIDER_SITE_OTHER): Payer: Self-pay | Admitting: General Surgery

## 2014-03-09 DIAGNOSIS — Z85038 Personal history of other malignant neoplasm of large intestine: Secondary | ICD-10-CM

## 2014-03-12 ENCOUNTER — Other Ambulatory Visit (INDEPENDENT_AMBULATORY_CARE_PROVIDER_SITE_OTHER): Payer: Self-pay | Admitting: General Surgery

## 2014-03-20 ENCOUNTER — Other Ambulatory Visit: Payer: Self-pay | Admitting: Otolaryngology

## 2014-04-27 ENCOUNTER — Encounter (HOSPITAL_BASED_OUTPATIENT_CLINIC_OR_DEPARTMENT_OTHER): Payer: Self-pay | Admitting: *Deleted

## 2014-04-27 NOTE — Progress Notes (Signed)
Will need istat-had colectomy 5/15

## 2014-04-27 NOTE — Progress Notes (Signed)
   04/27/14 1015  OBSTRUCTIVE SLEEP APNEA  Have you ever been diagnosed with sleep apnea through a sleep study? No  Do you snore loudly (loud enough to be heard through closed doors)?  0  Do you often feel tired, fatigued, or sleepy during the daytime? 0  Has anyone observed you stop breathing during your sleep? 0  Do you have, or are you being treated for high blood pressure? 1  BMI more than 35 kg/m2? 0  Age over 53 years old? 1  Neck circumference greater than 40 cm/16 inches? 1  Gender: 1  Obstructive Sleep Apnea Score 4  Score 4 or greater  Results sent to PCP

## 2014-05-04 ENCOUNTER — Ambulatory Visit (HOSPITAL_BASED_OUTPATIENT_CLINIC_OR_DEPARTMENT_OTHER): Payer: PRIVATE HEALTH INSURANCE | Admitting: Anesthesiology

## 2014-05-04 ENCOUNTER — Encounter (HOSPITAL_BASED_OUTPATIENT_CLINIC_OR_DEPARTMENT_OTHER): Payer: Self-pay | Admitting: Anesthesiology

## 2014-05-04 ENCOUNTER — Ambulatory Visit (HOSPITAL_BASED_OUTPATIENT_CLINIC_OR_DEPARTMENT_OTHER)
Admission: RE | Admit: 2014-05-04 | Discharge: 2014-05-04 | Disposition: A | Discharge status: Home / Self Care | Payer: PRIVATE HEALTH INSURANCE | Source: Ambulatory Visit | Attending: Otolaryngology | Admitting: Otolaryngology

## 2014-05-04 ENCOUNTER — Encounter (HOSPITAL_BASED_OUTPATIENT_CLINIC_OR_DEPARTMENT_OTHER)
Admission: RE | Disposition: A | Discharge status: Home / Self Care | Payer: Self-pay | Source: Ambulatory Visit | Attending: Otolaryngology

## 2014-05-04 DIAGNOSIS — J342 Deviated nasal septum: Secondary | ICD-10-CM | POA: Diagnosis not present

## 2014-05-04 DIAGNOSIS — J3489 Other specified disorders of nose and nasal sinuses: Secondary | ICD-10-CM | POA: Insufficient documentation

## 2014-05-04 DIAGNOSIS — R1310 Dysphagia, unspecified: Secondary | ICD-10-CM | POA: Insufficient documentation

## 2014-05-04 DIAGNOSIS — F418 Other specified anxiety disorders: Secondary | ICD-10-CM | POA: Diagnosis not present

## 2014-05-04 DIAGNOSIS — I10 Essential (primary) hypertension: Secondary | ICD-10-CM | POA: Diagnosis not present

## 2014-05-04 DIAGNOSIS — E119 Type 2 diabetes mellitus without complications: Secondary | ICD-10-CM | POA: Diagnosis not present

## 2014-05-04 DIAGNOSIS — K219 Gastro-esophageal reflux disease without esophagitis: Secondary | ICD-10-CM | POA: Diagnosis not present

## 2014-05-04 DIAGNOSIS — Z794 Long term (current) use of insulin: Secondary | ICD-10-CM | POA: Diagnosis not present

## 2014-05-04 DIAGNOSIS — Z85038 Personal history of other malignant neoplasm of large intestine: Secondary | ICD-10-CM | POA: Diagnosis not present

## 2014-05-04 DIAGNOSIS — J343 Hypertrophy of nasal turbinates: Secondary | ICD-10-CM | POA: Insufficient documentation

## 2014-05-04 DIAGNOSIS — J384 Edema of larynx: Secondary | ICD-10-CM | POA: Insufficient documentation

## 2014-05-04 DIAGNOSIS — J310 Chronic rhinitis: Secondary | ICD-10-CM | POA: Insufficient documentation

## 2014-05-04 HISTORY — PX: TURBINATE REDUCTION: SHX6157

## 2014-05-04 HISTORY — PX: SEPTOPLASTY: SHX2393

## 2014-05-04 LAB — GLUCOSE, CAPILLARY: Glucose-Capillary: 146 mg/dL — ABNORMAL HIGH (ref 70–99)

## 2014-05-04 LAB — POCT I-STAT, CHEM 8
BUN: 18 mg/dL (ref 6–23)
CALCIUM ION: 1.05 mmol/L — AB (ref 1.12–1.23)
Chloride: 105 mEq/L (ref 96–112)
Creatinine, Ser: 0.9 mg/dL (ref 0.50–1.35)
Glucose, Bld: 151 mg/dL — ABNORMAL HIGH (ref 70–99)
HCT: 44 % (ref 39.0–52.0)
HEMOGLOBIN: 15 g/dL (ref 13.0–17.0)
Potassium: 3.7 mmol/L (ref 3.5–5.1)
Sodium: 140 mmol/L (ref 135–145)
TCO2: 21 mmol/L (ref 0–100)

## 2014-05-04 SURGERY — SEPTOPLASTY, NOSE
Anesthesia: General | Site: Nose

## 2014-05-04 MED ORDER — HYDROMORPHONE HCL 1 MG/ML IJ SOLN
0.2500 mg | INTRAMUSCULAR | Status: DC | PRN
  Administered 2014-05-04 (×2): 0.5 mg via INTRAVENOUS

## 2014-05-04 MED ORDER — HYDROMORPHONE HCL 1 MG/ML IJ SOLN: 0.2500 mg | INTRAMUSCULAR | Status: DC | PRN

## 2014-05-04 MED ORDER — OXYCODONE-ACETAMINOPHEN 5-325 MG PO TABS: 1.0000 | ORAL_TABLET | ORAL | Status: DC | PRN

## 2014-05-04 MED ORDER — OXYCODONE HCL 5 MG PO TABS
5.0000 mg | ORAL_TABLET | Freq: Once | ORAL | Status: AC | PRN
  Administered 2014-05-04: 5 mg via ORAL

## 2014-05-04 MED ORDER — SCOPOLAMINE 1 MG/3DAYS TD PT72
1.0000 | MEDICATED_PATCH | TRANSDERMAL | Status: DC
  Administered 2014-05-04: 1.5 mg via TRANSDERMAL

## 2014-05-04 MED ORDER — FENTANYL CITRATE 0.05 MG/ML IJ SOLN
INTRAMUSCULAR | Status: AC
  Filled 2014-05-04: qty 4

## 2014-05-04 MED ORDER — ONDANSETRON HCL 4 MG/2ML IJ SOLN: 4.0000 mg | Freq: Once | INTRAMUSCULAR | Status: DC | PRN

## 2014-05-04 MED ORDER — MUPIROCIN 2 % EX OINT
TOPICAL_OINTMENT | CUTANEOUS | Status: DC | PRN
  Administered 2014-05-04: 1 via NASAL

## 2014-05-04 MED ORDER — OXYMETAZOLINE HCL 0.05 % NA SOLN
NASAL | Status: DC | PRN
  Administered 2014-05-04: 1 via NASAL

## 2014-05-04 MED ORDER — AMOXICILLIN 875 MG PO TABS: 875.0000 mg | ORAL_TABLET | Freq: Two times a day (BID) | ORAL | Status: DC

## 2014-05-04 MED ORDER — MIDAZOLAM HCL 2 MG/2ML IJ SOLN
INTRAMUSCULAR | Status: AC
  Filled 2014-05-04: qty 2

## 2014-05-04 MED ORDER — SUCCINYLCHOLINE CHLORIDE 20 MG/ML IJ SOLN
INTRAMUSCULAR | Status: DC | PRN
  Administered 2014-05-04: 100 mg via INTRAVENOUS

## 2014-05-04 MED ORDER — MIDAZOLAM HCL 2 MG/2ML IJ SOLN: 1.0000 mg | INTRAMUSCULAR | Status: DC | PRN

## 2014-05-04 MED ORDER — OXYCODONE HCL 5 MG PO TABS
ORAL_TABLET | ORAL | Status: AC
  Filled 2014-05-04: qty 1

## 2014-05-04 MED ORDER — HYDROMORPHONE HCL 1 MG/ML IJ SOLN
INTRAMUSCULAR | Status: AC
  Filled 2014-05-04: qty 1

## 2014-05-04 MED ORDER — CEFAZOLIN SODIUM-DEXTROSE 2-3 GM-% IV SOLR
INTRAVENOUS | Status: AC
  Filled 2014-05-04: qty 50

## 2014-05-04 MED ORDER — PROPOFOL 10 MG/ML IV BOLUS
INTRAVENOUS | Status: DC | PRN
  Administered 2014-05-04: 200 mg via INTRAVENOUS

## 2014-05-04 MED ORDER — BACITRACIN ZINC 500 UNIT/GM EX OINT
TOPICAL_OINTMENT | CUTANEOUS | Status: AC
  Filled 2014-05-04: qty 28.35

## 2014-05-04 MED ORDER — FENTANYL CITRATE 0.05 MG/ML IJ SOLN
INTRAMUSCULAR | Status: DC | PRN
Start: 2014-05-04 — End: 2014-05-04
  Administered 2014-05-04 (×2): 25 ug via INTRAVENOUS
  Administered 2014-05-04: 50 ug via INTRAVENOUS
  Administered 2014-05-04: 100 ug via INTRAVENOUS

## 2014-05-04 MED ORDER — LIDOCAINE HCL (CARDIAC) 20 MG/ML IV SOLN
INTRAVENOUS | Status: DC | PRN
  Administered 2014-05-04: 100 mg via INTRAVENOUS

## 2014-05-04 MED ORDER — FENTANYL CITRATE 0.05 MG/ML IJ SOLN: 50.0000 ug | INTRAMUSCULAR | Status: DC | PRN

## 2014-05-04 MED ORDER — LIDOCAINE-EPINEPHRINE 1 %-1:100000 IJ SOLN
INTRAMUSCULAR | Status: AC
  Filled 2014-05-04: qty 1

## 2014-05-04 MED ORDER — ONDANSETRON HCL 4 MG/2ML IJ SOLN
INTRAMUSCULAR | Status: DC | PRN
  Administered 2014-05-04: 4 mg via INTRAVENOUS

## 2014-05-04 MED ORDER — OXYCODONE HCL 5 MG/5ML PO SOLN: 5.0000 mg | Freq: Once | ORAL | Status: AC | PRN

## 2014-05-04 MED ORDER — MUPIROCIN 2 % EX OINT
TOPICAL_OINTMENT | CUTANEOUS | Status: AC
  Filled 2014-05-04: qty 22

## 2014-05-04 MED ORDER — MIDAZOLAM HCL 5 MG/5ML IJ SOLN
INTRAMUSCULAR | Status: DC | PRN
  Administered 2014-05-04: 2 mg via INTRAVENOUS

## 2014-05-04 MED ORDER — LACTATED RINGERS IV SOLN
INTRAVENOUS | Status: DC
  Administered 2014-05-04 (×2): via INTRAVENOUS

## 2014-05-04 MED ORDER — CEFAZOLIN SODIUM-DEXTROSE 2-3 GM-% IV SOLR
INTRAVENOUS | Status: DC | PRN
  Administered 2014-05-04: 2 g via INTRAVENOUS

## 2014-05-04 MED ORDER — LIDOCAINE-EPINEPHRINE 1 %-1:100000 IJ SOLN
INTRAMUSCULAR | Status: DC | PRN
  Administered 2014-05-04: 4 mL

## 2014-05-04 MED ORDER — DEXAMETHASONE SODIUM PHOSPHATE 4 MG/ML IJ SOLN
INTRAMUSCULAR | Status: DC | PRN
  Administered 2014-05-04: 10 mg via INTRAVENOUS

## 2014-05-04 MED ORDER — OXYMETAZOLINE HCL 0.05 % NA SOLN
NASAL | Status: AC
  Filled 2014-05-04: qty 15

## 2014-05-04 MED ORDER — EPHEDRINE SULFATE 50 MG/ML IJ SOLN
INTRAMUSCULAR | Status: DC | PRN
  Administered 2014-05-04: 5 mg via INTRAVENOUS

## 2014-05-04 SURGICAL SUPPLY — 37 items
ATTRACTOMAT 16X20 MAGNETIC DRP (DRAPES) IMPLANT
BLADE SURG 15 STRL LF DISP TIS (BLADE) IMPLANT
BLADE SURG 15 STRL SS (BLADE) ×4
CANISTER SUCT 1200ML W/VALVE (MISCELLANEOUS) ×4 IMPLANT
COAGULATOR SUCT 8FR VV (MISCELLANEOUS) ×4 IMPLANT
DECANTER SPIKE VIAL GLASS SM (MISCELLANEOUS) IMPLANT
DRSG NASOPORE 8CM (GAUZE/BANDAGES/DRESSINGS) IMPLANT
DRSG TELFA 3X8 NADH (GAUZE/BANDAGES/DRESSINGS) IMPLANT
ELECT REM PT RETURN 9FT ADLT (ELECTROSURGICAL) ×4
ELECTRODE REM PT RTRN 9FT ADLT (ELECTROSURGICAL) ×2 IMPLANT
GAUZE SPONGE 2X2 12PLY NS (GAUZE/BANDAGES/DRESSINGS) ×2 IMPLANT
GLOVE BIO SURGEON STRL SZ7.5 (GLOVE) ×4 IMPLANT
GLOVE SURG SS PI 7.0 STRL IVOR (GLOVE) ×2 IMPLANT
GOWN STRL REUS W/ TWL LRG LVL3 (GOWN DISPOSABLE) ×4 IMPLANT
GOWN STRL REUS W/TWL LRG LVL3 (GOWN DISPOSABLE) ×8
NDL HYPO 25X1 1.5 SAFETY (NEEDLE) ×2 IMPLANT
NEEDLE HYPO 25X1 1.5 SAFETY (NEEDLE) ×4 IMPLANT
NS IRRIG 1000ML POUR BTL (IV SOLUTION) ×4 IMPLANT
PACK BASIN DAY SURGERY FS (CUSTOM PROCEDURE TRAY) ×4 IMPLANT
PACK ENT DAY SURGERY (CUSTOM PROCEDURE TRAY) ×4 IMPLANT
PAD DRESSING TELFA 3X8 NADH (GAUZE/BANDAGES/DRESSINGS) IMPLANT
PATTIES SURGICAL .5 X3 (DISPOSABLE) IMPLANT
SLEEVE SCD COMPRESS KNEE MED (MISCELLANEOUS) ×2 IMPLANT
SOLUTION BUTLER CLEAR DIP (MISCELLANEOUS) ×4 IMPLANT
SPLINT NASAL AIRWAY SILICONE (MISCELLANEOUS) IMPLANT
SPONGE GAUZE 2X2 8PLY STER LF (GAUZE/BANDAGES/DRESSINGS) ×1
SPONGE GAUZE 2X2 8PLY STRL LF (GAUZE/BANDAGES/DRESSINGS) ×3 IMPLANT
SPONGE NEURO XRAY DETECT 1X3 (DISPOSABLE) ×4 IMPLANT
SUT CHROMIC 4 0 P 3 18 (SUTURE) ×4 IMPLANT
SUT PLAIN 4 0 ~~LOC~~ 1 (SUTURE) ×4 IMPLANT
SUT PROLENE 3 0 PS 2 (SUTURE) ×2 IMPLANT
SUT VIC AB 4-0 P-3 18XBRD (SUTURE) IMPLANT
SUT VIC AB 4-0 P3 18 (SUTURE) ×4
TOWEL OR 17X24 6PK STRL BLUE (TOWEL DISPOSABLE) ×4 IMPLANT
TUBE SALEM SUMP 12R W/ARV (TUBING) IMPLANT
TUBE SALEM SUMP 16 FR W/ARV (TUBING) ×2 IMPLANT
YANKAUER SUCT BULB TIP NO VENT (SUCTIONS) ×4 IMPLANT

## 2014-05-04 NOTE — Anesthesia Procedure Notes (Signed)
Procedure Name: Intubation Date/Time: 05/04/2014 9:53 AM Performed by: Lieutenant Diego Pre-anesthesia Checklist: Patient identified, Emergency Drugs available, Suction available and Patient being monitored Patient Re-evaluated:Patient Re-evaluated prior to inductionOxygen Delivery Method: Circle System Utilized Preoxygenation: Pre-oxygenation with 100% oxygen Intubation Type: IV induction Ventilation: Mask ventilation without difficulty Laryngoscope Size: Miller and 2 Grade View: Grade I Tube type: Oral Number of attempts: 1 Airway Equipment and Method: stylet and oral airway Placement Confirmation: ETT inserted through vocal cords under direct vision,  positive ETCO2 and breath sounds checked- equal and bilateral Secured at: 22 cm Tube secured with: Tape Dental Injury: Teeth and Oropharynx as per pre-operative assessment

## 2014-05-04 NOTE — H&P (Signed)
Cc: Chronic nasal congestion  HPI: The patient is a 53 y/o male who presents today with complaints of chronic nasal congestion and globus sensation. The patient has noted post nasal drainage for several years. In May following surgery for colon cancer, the patient noted a persistent globus sensation and difficulty swallowing and breathing at times.  The patient notes occasional reflux symptoms but is not currently taking any reflux medication. The patient denies any dysphagia, odynophagia, or dysphonia.  The patient also presents today with complaints of chronic nasal congestion. He feels like the left side of his nose has constant crusting and congestion.  This has been ongoing for many years. The patient denies any history of recurrent sinus infections or allergies. He has tried several different nasal sprays and OTC medications without relief. Tobacco use is denied.    The patient's review of systems (constitutional, eyes, ENT, cardiovascular, respiratory, GI, musculoskeletal, skin, neurologic, psychiatric, endocrine, hematologic, allergic) is noted in the ROS questionnaire.  It is reviewed with the patient.   Allergies: NKDA.   Family health history: None.   Major events: Colon cancer.   Ongoing medical problems: Hypertension, reflux, depression,anxiety disorder, diabetes .   Social history: The patient is married. He denies the use of tobacco or illegal drugs. He seldom drinks alcohol.  Exam: General: Communicates without difficulty, well nourished, no acute distress. Head: Normocephalic, no evidence injury, no tenderness, facial buttresses intact without stepoff. Eyes: PERRL, EOMI. No scleral icterus, conjunctivae clear. Neuro: CN II exam reveals vision grossly intact.  No nystagmus at any point of gaze. Ears: Auricles well formed without lesions.  Ear canals are intact without mass or lesion.  No erythema or edema is appreciated.  The TMs are intact without fluid. Nose: External evaluation  reveals normal support and skin without lesions.  Dorsum is intact.  Anterior rhinoscopy reveals congested and edematous mucosa over anterior aspect of the inferior turbinates and nasal septum.  No purulence is noted. Middle meatus is not well visualized. Oral:  Oral cavity and oropharynx are intact, symmetric, without erythema or edema.  Mucosa is moist without lesions. Neck: Full range of motion without pain.  There is no significant lymphadenopathy.  No masses palpable.  Thyroid bed within normal limits to palpation.  Parotid glands and submandibular glands equal bilaterally without mass.  Trachea is midline. Neuro:  CN 2-12 grossly intact. Gait normal. Vestibular: No nystagmus at any point of gaze.   Procedure:  Flexible Nasal Endoscopy: Risks, benefits, and alternatives of flexible endoscopy were explained to the patient.  Specific mention was made of the risk of throat numbness with difficulty swallowing, possible bleeding from the nose and mouth, and pain from the procedure.  The patient gave oral consent to proceed.  The nasal cavities were decongested and anesthetised with a combination of oxymetazoline and 4% lidocaine solution.  The flexible scope was inserted into the right nasal cavity.  Endoscopy of the inferior and middle meatus was performed.  The edematous mucosa was as described above.  No polyp, mass, or lesion was appreciated.  Olfactory cleft was clear.  Nasopharynx was clear.  Turbinates were hypertrophied but without mass.  Incomplete response to decongestion.  The procedure was repeated on the contralateral side with large left septal spur impinging on the lateral nasal wall. Posterior laryngeal edema and erythema was noted. Vocal cords were mobile without mass or lesion.   The patient tolerated the procedure well.  Instructions were given to avoid eating or drinking for 2 hours.  Assessment 1.  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  Moderate  posterior laryngeal edema is noted today.  No other suspicious mass or lesion is noted on today's fiberoptic laryngoscopy exam. 2.  Chronic rhinitis with large left septal spur and impingement on the lateral nasal wall.  3.  No purulent drainage, polyps, or other suspicious mass or lesion is noted.  Plan 1.  Nasal endoscopy and laryngoscopy findings are discussed with the patient. All questions and concerns are addressed. 2.  Diet and sleep modification instructions for gastroesophageal reflux are discussed with the patient. 3. Omeprazole 20mg  po BID. 4. Treatment options for his chronic nasal congestion include continuing medical management versus septoplasty with bilateral inferior turbinate reduction.  The risks, benefits, alternatives, and details of the procedure are reviewed with the patient. Questions are invited and answered. 5.  The patient would like to proceed with surgical intervention. The procedures will be scheduled in accordance with the patient's schedule.

## 2014-05-04 NOTE — Anesthesia Preprocedure Evaluation (Addendum)
Anesthesia Evaluation  Patient identified by MRN, date of birth, ID band Patient awake    Reviewed: Allergy & Precautions, H&P , NPO status , Patient's Chart, lab work & pertinent test results  Airway Mallampati: II  TM Distance: >3 FB     Dental no notable dental hx. (+) Teeth Intact, Dental Advisory Given   Pulmonary neg pulmonary ROS,  breath sounds clear to auscultation  Pulmonary exam normal       Cardiovascular hypertension, Pt. on medications Rhythm:Regular Rate:Normal     Neuro/Psych Anxiety Depression negative neurological ROS  negative psych ROS   GI/Hepatic negative GI ROS, Neg liver ROS,   Endo/Other  diabetes, Well Controlled, Type 2, Insulin Dependent, Oral Hypoglycemic Agents  Renal/GU negative Renal ROS  negative genitourinary   Musculoskeletal   Abdominal   Peds  Hematology negative hematology ROS (+)   Anesthesia Other Findings   Reproductive/Obstetrics negative OB ROS                            Anesthesia Physical Anesthesia Plan  ASA: III  Anesthesia Plan: General   Post-op Pain Management:    Induction: Intravenous  Airway Management Planned: Oral ETT  Additional Equipment:   Intra-op Plan:   Post-operative Plan: Extubation in OR  Informed Consent: I have reviewed the patients History and Physical, chart, labs and discussed the procedure including the risks, benefits and alternatives for the proposed anesthesia with the patient or authorized representative who has indicated his/her understanding and acceptance.   Dental advisory given  Plan Discussed with: CRNA, Anesthesiologist and Surgeon  Anesthesia Plan Comments:        Anesthesia Quick Evaluation

## 2014-05-04 NOTE — Op Note (Signed)
DATE OF PROCEDURE: 05/04/2014  OPERATIVE REPORT   SURGEON: Leta Baptist, MD   PREOPERATIVE DIAGNOSES:  1. Severe nasal septal deviation.  2. Bilateral inferior turbinate hypertrophy.  3. Chronic nasal obstruction.  POSTOPERATIVE DIAGNOSES:  1. Severe nasal septal deviation.  2. Bilateral inferior turbinate hypertrophy.  3. Chronic nasal obstruction.  PROCEDURE PERFORMED:  1. Septoplasty.  2. Bilateral partial inferior turbinate resection.   ANESTHESIA: General endotracheal tube anesthesia.   COMPLICATIONS: None.   ESTIMATED BLOOD LOSS: Less than 50 mL.   INDICATION FOR PROCEDURE: Robert Moore is a 53 y.o. male with a history of chronic nasal obstruction. The patient was treated with antihistamine, decongestant, steroid nasal spray, and systemic steroids. However, the patient continues to be symptomatic. On examination, the patient was noted to have bilateral severe inferior turbinate hypertrophy and significant nasal septal deviation, causing significant nasal obstruction. Based on the above findings, the decision was made for the patient to undergo the above-stated procedure. The risks, benefits, alternatives, and details of the procedure were discussed with the patient. Questions were invited and answered. Informed consent was obtained.   DESCRIPTION OF PROCEDURE: The patient was taken to the operating room and placed supine on the operating table. General endotracheal tube anesthesia was administered by the anesthesiologist. The patient was positioned, and prepped and draped in the standard fashion for nasal surgery. Pledgets soaked with Afrin were placed in both nasal cavities for decongestion. The pledgets were subsequently removed. The above mentioned severe septal deviation was again noted. 1% lidocaine with 1:100,000 epinephrine was injected onto the nasal septum bilaterally. A hemitransfixion incision was made on the right side. The mucosal flap was carefully elevated on the right  side. A cartilaginous incision was made 1 cm superior to the caudal margin of the nasal septum. Mucosal flap was also elevated on the left side in the similar fashion. It should be noted that due to the severe septal deviation, the deviated portion of the cartilaginous and bony septum had to be removed in piecemeal fashion. Once the deviated portions were removed, a straight midline septum was achieved. The septum was then quilted with 4-0 plain gut sutures. The hemitransfixion incision was closed with interrupted 4-0 chromic sutures. Doyle splints were applied.   Prior to the [facility] splint application, the inferior one half of both hypertrophied inferior turbinate was crossclamped with a Kelly clamp. The inferior one half of each inferior turbinate was then resected with a pair of cross cutting scissors. Hemostasis was achieved with a suction cautery device.   The care of the patient was turned over to the anesthesiologist. The patient was awakened from anesthesia without difficulty. The patient was extubated and transferred to the recovery room in good condition.   OPERATIVE FINDINGS: Severe nasal septal deviation and bilateral inferior turbinate hypertrophy.   SPECIMEN: None.   FOLLOWUP CARE: The patient be discharged home once he is awake and alert. The patient will be placed on Percocet 1-2 tablets p.o. q.6 hours p.r.n. pain, and amoxicillin 875 mg p.o. b.i.d. for 5 days. The patient will follow up in my office in approximately 1 week for splint removal.   Arielys Wandersee Raynelle Bring, MD

## 2014-05-04 NOTE — Anesthesia Postprocedure Evaluation (Signed)
  Anesthesia Post-op Note  Patient: Robert Moore  Procedure(s) Performed: Procedure(s): SEPTOPLASTY (N/A) BILATERAL TURBINATE REDUCTION (Bilateral)  Patient Location: PACU  Anesthesia Type: General   Level of Consciousness: awake, alert  and oriented  Airway and Oxygen Therapy: Patient Spontanous Breathing  Post-op Pain: mild  Post-op Assessment: Post-op Vital signs reviewed  Post-op Vital Signs: Reviewed  Last Vitals:  Filed Vitals:   05/04/14 1145  BP: 128/77  Pulse: 92  Temp:   Resp: 16    Complications: No apparent anesthesia complications

## 2014-05-04 NOTE — Discharge Instructions (Signed)
Post Anesthesia Home Care Instructions  Activity: Get plenty of rest for the remainder of the day. A responsible adult should stay with you for 24 hours following the procedure.  For the next 24 hours, DO NOT: -Drive a car -Paediatric nurse -Drink alcoholic beverages -Take any medication unless instructed by your physician -Make any legal decisions or sign important papers.  Meals: Start with liquid foods such as gelatin or soup. Progress to regular foods as tolerated. Avoid greasy, spicy, heavy foods. If nausea and/or vomiting occur, drink only clear liquids until the nausea and/or vomiting subsides. Call your physician if vomiting continues.  Special Instructions/Symptoms: Your throat may feel dry or sore from the anesthesia or the breathing tube placed in your throat during surgery. If this causes discomfort, gargle with warm salt water. The discomfort should disappear within 24 hours.  --------------------  POSTOPERATIVE INSTRUCTIONS FOR PATIENTS HAVING NASAL OR SINUS OPERATIONS ACTIVITY: Restrict activity at home for the first two days, resting as much as possible. Light activity is best. You may usually return to work within a week. You should refrain from nose blowing, strenuous activity, or heavy lifting greater than 20lbs for a total of three weeks after your operation.  If sneezing cannot be avoided, sneeze with your mouth open. DISCOMFORT: You may experience a dull headache and pressure along with nasal congestion and discharge. These symptoms may be worse during the first week after the operation but may last as long as two to four weeks.  Please take Tylenol or the pain medication that has been prescribed for you. Do not take aspirin or aspirin containing medications since they may cause bleeding.  You may experience symptoms of post nasal drainage, nasal congestion, headaches and fatigue for two or three months after your operation.  BLEEDING: You may have some blood tinged  nasal drainage for approximately two weeks after the operation.  The discharge will be worse for the first week.  Please call our office at [phone] or go to the nearest hospital emergency room if you experience any of the following: heavy, bright red blood from your nose or mouth that lasts longer than ten minutes or coughing up or vomiting bright red blood or blood clots. GENERAL CONSIDERATIONS: 1. A gauze dressing will be placed on your upper lip to absorb any drainage after the operation. You may need to change this several times a day.  If you do not have very much drainage, you may remove the dressing.  Remember that you may gently wipe your nose with a tissue and sniff in, but DO NOT blow your nose. 2. Please keep all of your postoperative appointments.  Your final results after the operation will depend on proper follow-up.  The initial visit is usually four to seven days after the operation.  During this visit, the remaining nasal packing and internal septal splints will be removed.  Your nasal and sinus cavities will be cleaned.  During the second visit, your nasal and sinus cavities will be cleaned again. Have someone drive you to your first two postoperative appointments. We suggest that you take your prescribed pain medication about  hour prior to each of these two appointments.  3. How you care for your nose after the operation will influence the results that you obtain.  You should follow all directions, take your medication as prescribed, and call our office [phone] with any problems or questions. 4. You may be more comfortable sleeping with your head elevated on two pillows. 5. Do  not take any medications that we have not prescribed or recommended. WARNING SIGNS: if any of the following should occur, please call our office: 1. Bright red bleeding which lasts more than 10 minutes. 2. Persistent fever greater than 102F. 3. Persistent vomiting. 4. Severe and constant pain that is  not relieved by prescribed pain medication. 5. Trauma to the nose. 6. Rash or unusual side effects from any medicines.

## 2014-05-04 NOTE — Transfer of Care (Signed)
Immediate Anesthesia Transfer of Care Note  Patient: Robert Moore  Procedure(s) Performed: Procedure(s): SEPTOPLASTY (N/A) BILATERAL TURBINATE REDUCTION (Bilateral)  Patient Location: PACU  Anesthesia Type:General  Level of Consciousness: awake  Airway & Oxygen Therapy: Patient Spontanous Breathing and Patient connected to T-piece oxygen  Post-op Assessment: Report given to PACU RN and Post -op Vital signs reviewed and stable  Post vital signs: Reviewed and stable  Complications: No apparent anesthesia complications

## 2014-05-05 ENCOUNTER — Encounter (HOSPITAL_BASED_OUTPATIENT_CLINIC_OR_DEPARTMENT_OTHER): Payer: Self-pay | Admitting: Otolaryngology

## 2018-08-02 ENCOUNTER — Other Ambulatory Visit: Payer: Self-pay

## 2018-08-02 ENCOUNTER — Encounter (HOSPITAL_COMMUNITY): Payer: Self-pay | Admitting: *Deleted

## 2018-08-02 ENCOUNTER — Inpatient Hospital Stay (HOSPITAL_COMMUNITY)
Admission: EM | Admit: 2018-08-02 | Discharge: 2018-08-05 | DRG: 696 | Disposition: A | Discharge status: Home / Self Care | Payer: Managed Care, Other (non HMO) | Attending: Urology | Admitting: Urology

## 2018-08-02 DIAGNOSIS — Z794 Long term (current) use of insulin: Secondary | ICD-10-CM

## 2018-08-02 DIAGNOSIS — F329 Major depressive disorder, single episode, unspecified: Secondary | ICD-10-CM | POA: Diagnosis present

## 2018-08-02 DIAGNOSIS — Z79891 Long term (current) use of opiate analgesic: Secondary | ICD-10-CM

## 2018-08-02 DIAGNOSIS — R339 Retention of urine, unspecified: Secondary | ICD-10-CM

## 2018-08-02 DIAGNOSIS — Z87442 Personal history of urinary calculi: Secondary | ICD-10-CM

## 2018-08-02 DIAGNOSIS — Z79899 Other long term (current) drug therapy: Secondary | ICD-10-CM

## 2018-08-02 DIAGNOSIS — N138 Other obstructive and reflux uropathy: Secondary | ICD-10-CM | POA: Diagnosis present

## 2018-08-02 DIAGNOSIS — Z9049 Acquired absence of other specified parts of digestive tract: Secondary | ICD-10-CM

## 2018-08-02 DIAGNOSIS — N401 Enlarged prostate with lower urinary tract symptoms: Secondary | ICD-10-CM | POA: Diagnosis present

## 2018-08-02 DIAGNOSIS — I10 Essential (primary) hypertension: Secondary | ICD-10-CM | POA: Diagnosis present

## 2018-08-02 DIAGNOSIS — F419 Anxiety disorder, unspecified: Secondary | ICD-10-CM | POA: Diagnosis present

## 2018-08-02 DIAGNOSIS — E785 Hyperlipidemia, unspecified: Secondary | ICD-10-CM | POA: Diagnosis present

## 2018-08-02 DIAGNOSIS — R310 Gross hematuria: Secondary | ICD-10-CM | POA: Diagnosis not present

## 2018-08-02 DIAGNOSIS — Z85038 Personal history of other malignant neoplasm of large intestine: Secondary | ICD-10-CM

## 2018-08-02 DIAGNOSIS — E119 Type 2 diabetes mellitus without complications: Secondary | ICD-10-CM | POA: Diagnosis present

## 2018-08-02 LAB — CBG MONITORING, ED: Glucose-Capillary: 163 mg/dL — ABNORMAL HIGH (ref 70–99)

## 2018-08-02 LAB — COMPREHENSIVE METABOLIC PANEL
ALT: 35 U/L (ref 0–44)
ANION GAP: 9 (ref 5–15)
AST: 25 U/L (ref 15–41)
Albumin: 3.6 g/dL (ref 3.5–5.0)
Alkaline Phosphatase: 43 U/L (ref 38–126)
BUN: 16 mg/dL (ref 6–20)
CO2: 23 mmol/L (ref 22–32)
Calcium: 8.6 mg/dL — ABNORMAL LOW (ref 8.9–10.3)
Chloride: 109 mmol/L (ref 98–111)
Creatinine, Ser: 0.96 mg/dL (ref 0.61–1.24)
Glucose, Bld: 226 mg/dL — ABNORMAL HIGH (ref 70–99)
Potassium: 3.9 mmol/L (ref 3.5–5.1)
SODIUM: 141 mmol/L (ref 135–145)
Total Bilirubin: 0.5 mg/dL (ref 0.3–1.2)
Total Protein: 6.5 g/dL (ref 6.5–8.1)

## 2018-08-02 LAB — CBC WITH DIFFERENTIAL/PLATELET
Abs Immature Granulocytes: 0.06 10*3/uL (ref 0.00–0.07)
BASOS PCT: 1 %
Basophils Absolute: 0 10*3/uL (ref 0.0–0.1)
EOS ABS: 0 10*3/uL (ref 0.0–0.5)
Eosinophils Relative: 1 %
HCT: 34.8 % — ABNORMAL LOW (ref 39.0–52.0)
Hemoglobin: 10.6 g/dL — ABNORMAL LOW (ref 13.0–17.0)
Immature Granulocytes: 1 %
Lymphocytes Relative: 20 %
Lymphs Abs: 1.7 10*3/uL (ref 0.7–4.0)
MCH: 27.8 pg (ref 26.0–34.0)
MCHC: 30.5 g/dL (ref 30.0–36.0)
MCV: 91.3 fL (ref 80.0–100.0)
MONO ABS: 0.5 10*3/uL (ref 0.1–1.0)
Monocytes Relative: 7 %
NEUTROS PCT: 70 %
Neutro Abs: 6 10*3/uL (ref 1.7–7.7)
PLATELETS: 179 10*3/uL (ref 150–400)
RBC: 3.81 MIL/uL — AB (ref 4.22–5.81)
RDW: 14.3 % (ref 11.5–15.5)
WBC: 8.3 10*3/uL (ref 4.0–10.5)
nRBC: 0 % (ref 0.0–0.2)

## 2018-08-02 LAB — URINALYSIS, ROUTINE W REFLEX MICROSCOPIC: WBC, UA: >50 WBC/hpf — ABNORMAL HIGH (ref 0–5)

## 2018-08-02 MED ORDER — INSULIN ASPART 100 UNIT/ML ~~LOC~~ SOLN
0.0000 [IU] | Freq: Three times a day (TID) | SUBCUTANEOUS | Status: DC
  Administered 2018-08-03: 8 [IU] via SUBCUTANEOUS
  Administered 2018-08-03: 3 [IU] via SUBCUTANEOUS
  Administered 2018-08-03: 2 [IU] via SUBCUTANEOUS
  Administered 2018-08-04: 5 [IU] via SUBCUTANEOUS
  Administered 2018-08-04: 3 [IU] via SUBCUTANEOUS
  Administered 2018-08-04 – 2018-08-05 (×2): 5 [IU] via SUBCUTANEOUS

## 2018-08-02 MED ORDER — TAMSULOSIN HCL 0.4 MG PO CAPS
0.4000 mg | ORAL_CAPSULE | Freq: Every day | ORAL | Status: DC
  Administered 2018-08-03 – 2018-08-04 (×3): 0.4 mg via ORAL
  Filled 2018-08-02 (×3): qty 1

## 2018-08-02 MED ORDER — PANTOPRAZOLE SODIUM 40 MG PO TBEC
40.0000 mg | DELAYED_RELEASE_TABLET | Freq: Every day | ORAL | Status: DC
  Administered 2018-08-03 – 2018-08-05 (×4): 40 mg via ORAL
  Filled 2018-08-02 (×4): qty 1

## 2018-08-02 MED ORDER — ALPRAZOLAM 0.25 MG PO TABS
0.2500 mg | ORAL_TABLET | Freq: Three times a day (TID) | ORAL | Status: DC | PRN
  Administered 2018-08-04 (×2): 0.25 mg via ORAL
  Filled 2018-08-02 (×2): qty 1

## 2018-08-02 MED ORDER — BUPROPION HCL ER (XL) 300 MG PO TB24
300.0000 mg | ORAL_TABLET | Freq: Every morning | ORAL | Status: DC
  Administered 2018-08-03 – 2018-08-05 (×3): 300 mg via ORAL
  Filled 2018-08-02 (×3): qty 1

## 2018-08-02 MED ORDER — INSULIN ASPART 100 UNIT/ML ~~LOC~~ SOLN
0.0000 [IU] | Freq: Every day | SUBCUTANEOUS | Status: DC
  Administered 2018-08-04: 3 [IU] via SUBCUTANEOUS

## 2018-08-02 MED ORDER — LISINOPRIL 20 MG PO TABS
20.0000 mg | ORAL_TABLET | Freq: Every day | ORAL | Status: DC
  Administered 2018-08-03 – 2018-08-05 (×3): 20 mg via ORAL
  Filled 2018-08-02 (×3): qty 1

## 2018-08-02 MED ORDER — PRAVASTATIN SODIUM 40 MG PO TABS
40.0000 mg | ORAL_TABLET | Freq: Every day | ORAL | Status: DC
  Administered 2018-08-03 – 2018-08-04 (×2): 40 mg via ORAL
  Filled 2018-08-02 (×2): qty 1

## 2018-08-02 MED ORDER — LISINOPRIL-HYDROCHLOROTHIAZIDE 20-12.5 MG PO TABS: 1.0000 | ORAL_TABLET | Freq: Every morning | ORAL | Status: DC

## 2018-08-02 MED ORDER — HYDROCHLOROTHIAZIDE 12.5 MG PO CAPS
12.5000 mg | ORAL_CAPSULE | Freq: Every day | ORAL | Status: DC
  Administered 2018-08-03 – 2018-08-05 (×3): 12.5 mg via ORAL
  Filled 2018-08-02 (×3): qty 1

## 2018-08-02 NOTE — ED Provider Notes (Signed)
Emergency Department Provider Note   I have reviewed the triage vital signs and the nursing notes.   HISTORY  Chief Complaint Urinary Retention   HPI Robert Moore is a 58 y.o. male with PMH of DM, HLD, HTN, and remote history of colon cancer resents to the emergency department for evaluation of urinary retention with hematuria.  Patient was seen by his PCP earlier in the week and started on Cipro with mild UTI symptoms and urine discomfort.  Patient has had increased hematuria in the past 24 hours.  Patient has not urinated normally since this morning.  He is having some lower abdominal distention and pressure sensation.  He denies any fevers, chills.  Is not anticoagulated.  No history of hematuria in the past.    Past Medical History:  Diagnosis Date  . Anxiety   . Cancer ([place])   . Depression   . Diabetes mellitus without complication ([place])   . Heart murmur   . History of kidney stones   . Hyperlipidemia   . Hypertension   . Pneumonia    years ago    Patient Active Problem List   Diagnosis Date Noted  . Gross hematuria 08/02/2018  . Colon cancer ([place]) 08/25/2013    Past Surgical History:  Procedure Laterality Date  . bone spur Right 2000   foot   . COLONOSCOPY    . ear tuck  1975  . LAPAROSCOPIC PARTIAL COLECTOMY N/A 09/25/2013   Procedure: LAPAROSCOPIC PARTIAL COLECTOMY;  Surgeon: Leighton Ruff, MD;  [facility];  Service: General;  Laterality: N/A;  . SEPTOPLASTY N/A 05/04/2014   Procedure: SEPTOPLASTY;  Surgeon: Ascencion Dike, MD;  Location: [facility];  Service: ENT;  Laterality: N/A;  . TURBINATE REDUCTION Bilateral 05/04/2014   Procedure: BILATERAL TURBINATE REDUCTION;  Surgeon: Ascencion Dike, MD;  Location: [facility];  Service: ENT;  Laterality: Bilateral;    Allergies Patient has no known allergies.  No family history on file.  Social History Social History   Tobacco Use  . Smoking status: Never Smoker  .  Smokeless tobacco: Never Used  Substance Use Topics  . Alcohol use: Yes    Comment: occ  . Drug use: No    Review of Systems  Constitutional: No fever/chills Eyes: No visual changes. ENT: No sore throat. Cardiovascular: Denies chest pain. Respiratory: Denies shortness of breath. Gastrointestinal: No abdominal pain.  No nausea, no vomiting.  No diarrhea.  No constipation. Genitourinary: Positive urinary retention and hematuria.  Musculoskeletal: Negative for back pain. Skin: Negative for rash. Neurological: Negative for headaches, focal weakness or numbness.  10-point ROS otherwise negative.  ____________________________________________   PHYSICAL EXAM:  VITAL SIGNS: ED Triage Vitals  Enc Vitals Group     BP 08/02/18 1649 125/78     Pulse Rate 08/02/18 1649 (!) 124     Resp 08/02/18 1649 18     Temp 08/02/18 1649 97.8 F (36.6 C)     Temp Source 08/02/18 1649 Oral     SpO2 08/02/18 1649 99 %     Weight 08/02/18 1656 250 lb (113.4 kg)     Height 08/02/18 1656 5\' 10"  (1.778 m)     Pain Score 08/02/18 1655 3   Constitutional: Alert and oriented. Well appearing and in no acute distress. Eyes: Conjunctivae are normal. Head: Atraumatic. Nose: No congestion/rhinnorhea. Mouth/Throat: Mucous membranes are moist.  Oropharynx non-erythematous. Neck: No stridor.   Cardiovascular: Normal rate, regular rhythm. Good peripheral  circulation. Grossly normal heart sounds.   Respiratory: Normal respiratory effort.  No retractions. Lungs CTAB. Gastrointestinal: Soft with mild lower abdominal tenderness. Positive distention.  Musculoskeletal: No lower extremity tenderness nor edema. No gross deformities of extremities. Neurologic:  Normal speech and language. No gross focal neurologic deficits are appreciated.  Skin:  Skin is warm, dry and intact. No rash noted.  ____________________________________________   LABS (all labs ordered are listed, but only abnormal results are  displayed)  Labs Reviewed  URINALYSIS, ROUTINE W REFLEX MICROSCOPIC - Abnormal; Notable for the following components:      Result Value   Color, Urine RED (*)    APPearance TURBID (*)    Glucose, UA   (*)    Value: TEST NOT REPORTED DUE TO COLOR INTERFERENCE OF URINE PIGMENT   Hgb urine dipstick   (*)    Value: TEST NOT REPORTED DUE TO COLOR INTERFERENCE OF URINE PIGMENT   Bilirubin Urine   (*)    Value: TEST NOT REPORTED DUE TO COLOR INTERFERENCE OF URINE PIGMENT   Ketones, ur   (*)    Value: TEST NOT REPORTED DUE TO COLOR INTERFERENCE OF URINE PIGMENT   Protein, ur   (*)    Value: TEST NOT REPORTED DUE TO COLOR INTERFERENCE OF URINE PIGMENT   Nitrite   (*)    Value: TEST NOT REPORTED DUE TO COLOR INTERFERENCE OF URINE PIGMENT   Leukocytes,Ua   (*)    Value: TEST NOT REPORTED DUE TO COLOR INTERFERENCE OF URINE PIGMENT   RBC / HPF >50 (*)    WBC, UA >50 (*)    Bacteria, UA RARE (*)    All other components within normal limits  CBC WITH DIFFERENTIAL/PLATELET - Abnormal; Notable for the following components:   RBC 3.81 (*)    Hemoglobin 10.6 (*)    HCT 34.8 (*)    All other components within normal limits  COMPREHENSIVE METABOLIC PANEL - Abnormal; Notable for the following components:   Glucose, Bld 226 (*)    Calcium 8.6 (*)    All other components within normal limits  CBG MONITORING, ED - Abnormal; Notable for the following components:   Glucose-Capillary 163 (*)    All other components within normal limits   ____________________________________________   PROCEDURES  Procedure(s) performed:   Procedures  Limited Ultrasound of bladder  Performed by Dr. Laverta Baltimore Indication: to assess for urinary retention and/or bladder volume prior to urinary catheter Technique:  Low frequency probe utilized in two planes to assess bladder volume in real-time. Findings: Distended bladder with large volume clot and heterogenous material.   Additional findings: None    ____________________________________________   INITIAL IMPRESSION / ASSESSMENT AND PLAN / ED COURSE  Pertinent labs & imaging results that were available during my care of the patient were reviewed by me and considered in my medical decision making (see chart for details).   Patient presents to the emergency department for evaluation of urinary retention with hematuria.  Patient has clinical urinary obstruction.  I have ordered Foley catheter along with bladder irrigation.  Will follow UA and have ordered CBC and CMP with hematuria to assess renal function, H/H, and platelet count.   3-way foley placed by nursing. I irrigated and flushed the cath. Large volume clot and thin, dark material from the bladder. No pain with procedure. Discussed with Dr. Alyson Ingles who will see/admit the patient in the ED.   Discussed patient's case with Urology to request admission. Patient and family (if  present) updated with plan. Care transferred to Urology service.  I reviewed all nursing notes, vitals, pertinent old records, EKGs, labs, imaging (as available).  ____________________________________________  FINAL CLINICAL IMPRESSION(S) / ED DIAGNOSES  Final diagnoses:  Urinary retention    MEDICATIONS GIVEN DURING THIS VISIT:  Medications  ALPRAZolam (XANAX) tablet 0.25 mg (has no administration in time range)  buPROPion (WELLBUTRIN XL) 24 hr tablet 300 mg (has no administration in time range)  lisinopril-hydrochlorothiazide (PRINZIDE,ZESTORETIC) 20-12.5 MG per tablet 1 tablet (has no administration in time range)  pravastatin (PRAVACHOL) tablet 40 mg (has no administration in time range)  pantoprazole (PROTONIX) EC tablet 40 mg (has no administration in time range)  tamsulosin (FLOMAX) capsule 0.4 mg (has no administration in time range)  insulin aspart (novoLOG) injection 0-15 Units (has no administration in time range)  insulin aspart (novoLOG) injection 0-5 Units (has no administration in time  range)    Note:  This document was prepared using Dragon voice recognition software and may include unintentional dictation errors.  Nanda Quinton, MD Emergency Medicine    Milda Lindvall, [facility], MD 08/02/18 2325

## 2018-08-02 NOTE — ED Notes (Signed)
ER provider at bedside irrigating foley catheter. Pt tolerating well. Able to manually irrigate but foley not continuously draining. Awaiting urology.

## 2018-08-02 NOTE — ED Triage Notes (Signed)
Pt presents to ER with a c/o urinary retention,  Pt sts Wednesday he developed gross hematuria and  was diagnosed with UTI on Wedensday and prescribed cipro, however since last night he hasn't been able to urinate other than passing blood clots and dribbling some urine. He reports urge to urinate and lower abdominal pain.

## 2018-08-02 NOTE — ED Notes (Signed)
ED TO INPATIENT HANDOFF REPORT  ED Nurse Name and Phone #: Tomi Likens, RN  S Name/Age/Gender Robert Moore 58 y.o. male Room/Bed: WOTF/NONE  Code Status   Code Status: Prior  Home/SNF/Other Home Patient oriented to: self, place, time and situation Is this baseline? Yes   Triage Complete: Triage complete  Chief Complaint UTI, sent by dr  Triage Note Pt presents to ER with a c/o urinary retention,  Pt sts Wednesday he developed gross hematuria and  was diagnosed with UTI on Wedensday and prescribed cipro, however since last night he hasn't been able to urinate other than passing blood clots and dribbling some urine. He reports urge to urinate and lower abdominal pain.   Allergies No Known Allergies  Level of Care/Admitting Diagnosis ED Disposition    ED Disposition Condition Comment   Admit  Hospital Area: [facility] [[id number]]  Level of Care: Med-Surg [16]  Diagnosis: Gross hematuria [599.71.ICD-9-CM]  Admitting Physician: Cleon Gustin [[id number]]  Attending Physician: Cleon Gustin [[id number]]  Bed request comments: 4th floor  PT Class (Do Not Modify): Observation [104]  PT Acc Code (Do Not Modify): Observation [10022]       B Medical/Surgery History Past Medical History:  Diagnosis Date  . Anxiety   . Cancer ([place])   . Depression   . Diabetes mellitus without complication ([place])   . Heart murmur   . History of kidney stones   . Hyperlipidemia   . Hypertension   . Pneumonia    years ago   Past Surgical History:  Procedure Laterality Date  . bone spur Right 2000   foot   . COLONOSCOPY    . ear tuck  1975  . LAPAROSCOPIC PARTIAL COLECTOMY N/A 09/25/2013   Procedure: LAPAROSCOPIC PARTIAL COLECTOMY;  Surgeon: Leighton Ruff, MD;  [facility];  Service: General;  Laterality: N/A;  . SEPTOPLASTY N/A 05/04/2014   Procedure: SEPTOPLASTY;  Surgeon: Ascencion Dike, MD;  Location: [facility];  Service: ENT;  Laterality: N/A;   . TURBINATE REDUCTION Bilateral 05/04/2014   Procedure: [place];  Surgeon: Ascencion Dike, MD;  Location: [facility];  Service: ENT;  Laterality: Bilateral;     A IV Location/Drains/Wounds Patient Lines/Drains/Airways Status   Active Line/Drains/Airways    Name:   Placement date:   Placement time:   Site:   Days:   Peripheral IV 08/02/18 Left Forearm   08/02/18    2320    Forearm   less than 1   Urethral Catheter Maddie RN Triple-lumen 24 Fr.   08/02/18    1830    Triple-lumen   less than 1   Incision (Closed) 05/04/14 Nose   05/04/14    1034     1551          Intake/Output Last 24 hours No intake or output data in the 24 hours ending 08/02/18 2331  Labs/Imaging Results for orders placed or performed during the hospital encounter of 08/02/18 (from the past 48 hour(s))  Urinalysis, Routine w reflex microscopic- may I&O cath if menses     Status: Abnormal   Collection Time: 08/02/18  8:14 PM  Result Value Ref Range   Color, Urine RED (A) YELLOW    Comment: BIOCHEMICALS MAY BE AFFECTED BY COLOR   APPearance TURBID (A) CLEAR   Specific Gravity, Urine  1.005 - 1.030    TEST NOT REPORTED DUE TO COLOR INTERFERENCE OF URINE PIGMENT   pH  5.0 - 8.0    TEST NOT REPORTED DUE TO COLOR INTERFERENCE OF URINE PIGMENT   Glucose, UA (A) NEGATIVE mg/dL    TEST NOT REPORTED DUE TO COLOR INTERFERENCE OF URINE PIGMENT   Hgb urine dipstick (A) NEGATIVE    TEST NOT REPORTED DUE TO COLOR INTERFERENCE OF URINE PIGMENT   Bilirubin Urine (A) NEGATIVE    TEST NOT REPORTED DUE TO COLOR INTERFERENCE OF URINE PIGMENT   Ketones, ur (A) NEGATIVE mg/dL    TEST NOT REPORTED DUE TO COLOR INTERFERENCE OF URINE PIGMENT   Protein, ur (A) NEGATIVE mg/dL    TEST NOT REPORTED DUE TO COLOR INTERFERENCE OF URINE PIGMENT   Nitrite (A) NEGATIVE    TEST NOT REPORTED DUE TO COLOR INTERFERENCE OF URINE PIGMENT   Leukocytes,Ua (A) NEGATIVE    TEST NOT REPORTED DUE TO COLOR INTERFERENCE  OF URINE PIGMENT   RBC / HPF >50 (H) 0 - 5 RBC/hpf   WBC, UA >50 (H) 0 - 5 WBC/hpf   Bacteria, UA RARE (A) NONE SEEN   Mucus PRESENT     Comment: Performed at [facility], [place] [address], [place], [place] [address]  CBC with Differential     Status: Abnormal   Collection Time: 08/02/18  8:14 PM  Result Value Ref Range   WBC 8.3 4.0 - 10.5 K/uL   RBC 3.81 (L) 4.22 - 5.81 MIL/uL   Hemoglobin 10.6 (L) 13.0 - 17.0 g/dL   HCT 34.8 (L) 39.0 - 52.0 %   MCV 91.3 80.0 - 100.0 fL   MCH 27.8 26.0 - 34.0 pg   MCHC 30.5 30.0 - 36.0 g/dL   RDW 14.3 11.5 - 15.5 %   Platelets 179 150 - 400 K/uL   nRBC 0.0 0.0 - 0.2 %   Neutrophils Relative % 70 %   Neutro Abs 6.0 1.7 - 7.7 K/uL   Lymphocytes Relative 20 %   Lymphs Abs 1.7 0.7 - 4.0 K/uL   Monocytes Relative 7 %   Monocytes Absolute 0.5 0.1 - 1.0 K/uL   Eosinophils Relative 1 %   Eosinophils Absolute 0.0 0.0 - 0.5 K/uL   Basophils Relative 1 %   Basophils Absolute 0.0 0.0 - 0.1 K/uL   Immature Granulocytes 1 %   Abs Immature Granulocytes 0.06 0.00 - 0.07 K/uL    Comment: Performed at [facility], [place] [address], [place], [place] [address]  Comprehensive metabolic panel     Status: Abnormal   Collection Time: 08/02/18  8:14 PM  Result Value Ref Range   Sodium 141 135 - 145 mmol/L   Potassium 3.9 3.5 - 5.1 mmol/L   Chloride 109 98 - 111 mmol/L   CO2 23 22 - 32 mmol/L   Glucose, Bld 226 (H) 70 - 99 mg/dL   BUN 16 6 - 20 mg/dL   Creatinine, Ser 0.96 0.61 - 1.24 mg/dL   Calcium 8.6 (L) 8.9 - 10.3 mg/dL   Total Protein 6.5 6.5 - 8.1 g/dL   Albumin 3.6 3.5 - 5.0 g/dL   AST 25 15 - 41 U/L   ALT 35 0 - 44 U/L   Alkaline Phosphatase 43 38 - 126 U/L   Total Bilirubin 0.5 0.3 - 1.2 mg/dL   GFR calc non Af Amer >60 >60 mL/min   GFR calc Af Amer >60 >60 mL/min   Anion gap 9 5 - 15    Comment: Performed at [facility], [place]  [address] [place], [place] [address]  CBG monitoring, ED      Status: Abnormal   Collection Time: 08/02/18  9:51 PM  Result Value Ref Range   Glucose-Capillary 163 (H) 70 - 99 mg/dL   No results found.  Pending Labs [employer] (From admission, onward)    Start     Ordered   Signed and Held  CBC  Tomorrow morning,   R     Signed and Held   Signed and Held  Basic metabolic panel  Tomorrow morning,   R     Signed and Held          Vitals/Pain Today's Vitals   08/02/18 1937 08/02/18 2013 08/02/18 2115 08/02/18 2245  BP: 127/80 (!) 143/85 135/69 (!) 152/95  Pulse: 71 90 83 83  Resp: 18 18 18 17   Temp:   98 F (36.7 C) 98 F (36.7 C)  TempSrc:   Oral Oral  SpO2: 97% 99% 97% 98%  Weight:      Height:      PainSc:        Isolation Precautions No active isolations  Medications Medications  ALPRAZolam (XANAX) tablet 0.25 mg (has no administration in time range)  buPROPion (WELLBUTRIN XL) 24 hr tablet 300 mg (has no administration in time range)  lisinopril-hydrochlorothiazide (PRINZIDE,ZESTORETIC) 20-12.5 MG per tablet 1 tablet (has no administration in time range)  pravastatin (PRAVACHOL) tablet 40 mg (has no administration in time range)  pantoprazole (PROTONIX) EC tablet 40 mg (has no administration in time range)  tamsulosin (FLOMAX) capsule 0.4 mg (has no administration in time range)  insulin aspart (novoLOG) injection 0-15 Units (has no administration in time range)  insulin aspart (novoLOG) injection 0-5 Units (has no administration in time range)    Mobility walks Low fall risk   Focused Assessments    R Recommendations: See Admitting Provider Note  Report given to:   Additional Notes:

## 2018-08-02 NOTE — ED Notes (Signed)
Per Maddie, RN-  3 way foley was inserted, bloody urine return on insertion.  When attempting to irrigate foley, flush was met with resistance. Unable to irrigate foley further. Blood was visualized coming out of pts urethra.

## 2018-08-02 NOTE — ED Notes (Signed)
Urology at bedside irrigating and starting CBI. Pt tolerating well. Educated pt on CBI and how to monitor self and stop CBI if pressure begins to build and pt needs help. Verbalized understanding.

## 2018-08-02 NOTE — ED Notes (Signed)
Foley checked. Pt tolerating well. No acute distress

## 2018-08-02 NOTE — ED Notes (Signed)
Attempted to irrigate bladder.  When flushed, urine returned from penis around catheter.  Urine is very bloody.

## 2018-08-02 NOTE — H&P (Signed)
Urology Admission H&P  Chief Complaint: Gross hematuria  History of Present Illness: Robert Moore is a 58yo with a history of BPH who presented to the ER with a 2 day history of gross hematuria and a 12 hour history of an inability to urinate. No prior episodes of gross hematuria. No working urinary urgency, frequency or dysuria. No other exacerbating/alleviating events. No other associated symptoms. A foley was placed in the ER for a PVR over 400cc and numerus clots drained. The catheter the clotted and Urology was consulted for management. The patient currently sees [name] for his BPH.   Past Medical History:  Diagnosis Date  . Anxiety   . Cancer ([place])   . Depression   . Diabetes mellitus without complication ([place])   . Heart murmur   . History of kidney stones   . Hyperlipidemia   . Hypertension   . Pneumonia    years ago   Past Surgical History:  Procedure Laterality Date  . bone spur Right 2000   foot   . COLONOSCOPY    . ear tuck  1975  . LAPAROSCOPIC PARTIAL COLECTOMY N/A 09/25/2013   Procedure: LAPAROSCOPIC PARTIAL COLECTOMY;  Surgeon: Leighton Ruff, MD;  [facility];  Service: General;  Laterality: N/A;  . SEPTOPLASTY N/A 05/04/2014   Procedure: SEPTOPLASTY;  Surgeon: Ascencion Dike, MD;  Location: [place];  Service: ENT;  Laterality: N/A;  . TURBINATE REDUCTION Bilateral 05/04/2014   Procedure: BILATERAL TURBINATE REDUCTION;  Surgeon: Ascencion Dike, MD;  Location: [place];  Service: ENT;  Laterality: Bilateral;    Home Medications:  No current facility-administered medications for this encounter.    Current Outpatient Medications  Medication Sig Dispense Refill Last Dose  . ALPRAZolam (XANAX) 0.25 MG tablet Take 0.25 mg by mouth 3 (three) times daily as needed for anxiety.   More than a month at Unknown time  . amoxicillin (AMOXIL) 875 MG tablet Take 1 tablet (875 mg total) by mouth 2 (two) times daily. 10 tablet 0   . buPROPion  (WELLBUTRIN XL) 300 MG 24 hr tablet Take 300 mg by mouth every morning.   05/04/2014 at 0600  . glipiZIDE (GLUCOTROL) 10 MG tablet Take 10 mg by mouth 2 (two) times daily before a meal.   05/03/2014 at Unknown time  . insulin glargine (LANTUS) 100 UNIT/ML injection Inject 75 Units into the skin at bedtime.   05/03/2014 at Unknown time  . lisinopril-hydrochlorothiazide (PRINZIDE,ZESTORETIC) 20-12.5 MG per tablet Take 1 tablet by mouth every morning.   05/04/2014 at 0600  . lovastatin (MEVACOR) 40 MG tablet Take 40 mg by mouth every morning.    05/04/2014 at 0600  . metFORMIN (GLUCOPHAGE) 500 MG tablet Take 1,000 mg by mouth 2 (two) times daily with a meal.   05/03/2014 at Unknown time  . Multiple Vitamin (MULTIVITAMIN WITH MINERALS) TABS tablet Take 1 tablet by mouth daily.   05/03/2014 at Unknown time  . omeprazole (PRILOSEC) 20 MG capsule Take 20 mg by mouth 2 (two) times daily before a meal.   05/04/2014 at 0600  . oxyCODONE-acetaminophen (ROXICET) 5-325 MG per tablet Take 1-2 tablets by mouth every 4 (four) hours as needed for moderate pain or severe pain. 30 tablet 0   . silodosin (RAPAFLO) 8 MG CAPS capsule Take 8 mg by mouth daily with breakfast.   05/04/2014 at 0600   Allergies: No Known Allergies  No family history on file. Social History:  reports that he  has never smoked. He has never used smokeless tobacco. He reports current alcohol use. He reports that he does not use drugs.  Review of Systems  Genitourinary: Positive for hematuria.  All other systems reviewed and are negative.   Physical Exam:  Vital signs in last 24 hours: Temp:  [97.8 F (36.6 C)] 97.8 F (36.6 C) (03/27 1649) Pulse Rate:  [71-124] 90 (03/27 2013) Resp:  [18] 18 (03/27 2013) BP: (125-143)/(78-85) 143/85 (03/27 2013) SpO2:  [97 %-99 %] 99 % (03/27 2013) Weight:  [113.4 kg] 113.4 kg (03/27 1656) Physical Exam  Constitutional: He is oriented to person, place, and time. He appears well-developed and  well-nourished.  HENT:  Head: Normocephalic and atraumatic.  Eyes: Pupils are equal, round, and reactive to light. EOM are normal.  Neck: Normal range of motion. No thyromegaly present.  Cardiovascular: Normal rate and regular rhythm.  Respiratory: Effort normal. No respiratory distress.  GI: Soft. He exhibits no distension. There is no abdominal tenderness.  Musculoskeletal: Normal range of motion.        General: No edema.  Neurological: He is alert and oriented to person, place, and time.  Skin: Skin is warm and dry.  Psychiatric: He has a normal mood and affect. His behavior is normal. Judgment and thought content normal.    Laboratory Data:  Results for orders placed or performed during the hospital encounter of 08/02/18 (from the past 24 hour(s))  CBC with Differential     Status: Abnormal   Collection Time: 08/02/18  8:14 PM  Result Value Ref Range   WBC 8.3 4.0 - 10.5 K/uL   RBC 3.81 (L) 4.22 - 5.81 MIL/uL   Hemoglobin 10.6 (L) 13.0 - 17.0 g/dL   HCT 34.8 (L) 39.0 - 52.0 %   MCV 91.3 80.0 - 100.0 fL   MCH 27.8 26.0 - 34.0 pg   MCHC 30.5 30.0 - 36.0 g/dL   RDW 14.3 11.5 - 15.5 %   Platelets 179 150 - 400 K/uL   nRBC 0.0 0.0 - 0.2 %   Neutrophils Relative % 70 %   Neutro Abs 6.0 1.7 - 7.7 K/uL   Lymphocytes Relative 20 %   Lymphs Abs 1.7 0.7 - 4.0 K/uL   Monocytes Relative 7 %   Monocytes Absolute 0.5 0.1 - 1.0 K/uL   Eosinophils Relative 1 %   Eosinophils Absolute 0.0 0.0 - 0.5 K/uL   Basophils Relative 1 %   Basophils Absolute 0.0 0.0 - 0.1 K/uL   Immature Granulocytes 1 %   Abs Immature Granulocytes 0.06 0.00 - 0.07 K/uL  Comprehensive metabolic panel     Status: Abnormal   Collection Time: 08/02/18  8:14 PM  Result Value Ref Range   Sodium 141 135 - 145 mmol/L   Potassium 3.9 3.5 - 5.1 mmol/L   Chloride 109 98 - 111 mmol/L   CO2 23 22 - 32 mmol/L   Glucose, Bld 226 (H) 70 - 99 mg/dL   BUN 16 6 - 20 mg/dL   Creatinine, Ser 0.96 0.61 - 1.24 mg/dL   Calcium  8.6 (L) 8.9 - 10.3 mg/dL   Total Protein 6.5 6.5 - 8.1 g/dL   Albumin 3.6 3.5 - 5.0 g/dL   AST 25 15 - 41 U/L   ALT 35 0 - 44 U/L   Alkaline Phosphatase 43 38 - 126 U/L   Total Bilirubin 0.5 0.3 - 1.2 mg/dL   GFR calc non Af Amer >60 >60 mL/min   GFR  calc Af Amer >60 >60 mL/min   Anion gap 9 5 - 15   No results found for this or any previous visit (from the past 240 hour(s)). Creatinine: Recent Labs    08/02/18 2014  CREATININE 0.96   Baseline Creatinine: 1  Impression/Assessment:  57yo with gross hematuria and clot retention  Plan:  1. The foley was irrigated with 1L of NS and over 300cc of clot was irrigated. The catheter was attached to CBI and the patient will be admitted for IV antibiotics and CBI.  Nicolette Bang 08/02/2018, 9:14 PM

## 2018-08-03 ENCOUNTER — Encounter (HOSPITAL_COMMUNITY): Payer: Self-pay

## 2018-08-03 ENCOUNTER — Other Ambulatory Visit: Payer: Self-pay

## 2018-08-03 DIAGNOSIS — Z794 Long term (current) use of insulin: Secondary | ICD-10-CM | POA: Diagnosis not present

## 2018-08-03 DIAGNOSIS — F419 Anxiety disorder, unspecified: Secondary | ICD-10-CM | POA: Diagnosis present

## 2018-08-03 DIAGNOSIS — Z9049 Acquired absence of other specified parts of digestive tract: Secondary | ICD-10-CM | POA: Diagnosis not present

## 2018-08-03 DIAGNOSIS — Z79891 Long term (current) use of opiate analgesic: Secondary | ICD-10-CM | POA: Diagnosis not present

## 2018-08-03 DIAGNOSIS — Z85038 Personal history of other malignant neoplasm of large intestine: Secondary | ICD-10-CM | POA: Diagnosis not present

## 2018-08-03 DIAGNOSIS — N401 Enlarged prostate with lower urinary tract symptoms: Secondary | ICD-10-CM | POA: Diagnosis present

## 2018-08-03 DIAGNOSIS — F329 Major depressive disorder, single episode, unspecified: Secondary | ICD-10-CM | POA: Diagnosis present

## 2018-08-03 DIAGNOSIS — R310 Gross hematuria: Secondary | ICD-10-CM | POA: Diagnosis present

## 2018-08-03 DIAGNOSIS — E119 Type 2 diabetes mellitus without complications: Secondary | ICD-10-CM | POA: Diagnosis present

## 2018-08-03 DIAGNOSIS — Z87442 Personal history of urinary calculi: Secondary | ICD-10-CM | POA: Diagnosis not present

## 2018-08-03 DIAGNOSIS — N138 Other obstructive and reflux uropathy: Secondary | ICD-10-CM | POA: Diagnosis present

## 2018-08-03 DIAGNOSIS — R339 Retention of urine, unspecified: Secondary | ICD-10-CM | POA: Diagnosis present

## 2018-08-03 DIAGNOSIS — I10 Essential (primary) hypertension: Secondary | ICD-10-CM | POA: Diagnosis present

## 2018-08-03 DIAGNOSIS — Z79899 Other long term (current) drug therapy: Secondary | ICD-10-CM | POA: Diagnosis not present

## 2018-08-03 DIAGNOSIS — E785 Hyperlipidemia, unspecified: Secondary | ICD-10-CM | POA: Diagnosis present

## 2018-08-03 LAB — COMPREHENSIVE METABOLIC PANEL
ALK PHOS: 38 U/L (ref 38–126)
ALT: 30 U/L (ref 0–44)
AST: 26 U/L (ref 15–41)
Albumin: 3.1 g/dL — ABNORMAL LOW (ref 3.5–5.0)
Anion gap: 9 (ref 5–15)
BILIRUBIN TOTAL: 0.5 mg/dL (ref 0.3–1.2)
BUN: 14 mg/dL (ref 6–20)
CO2: 22 mmol/L (ref 22–32)
Calcium: 8.1 mg/dL — ABNORMAL LOW (ref 8.9–10.3)
Chloride: 110 mmol/L (ref 98–111)
Creatinine, Ser: 0.93 mg/dL (ref 0.61–1.24)
GFR calc Af Amer: >60 mL/min (ref >60)
GFR calc non Af Amer: >60 mL/min (ref >60)
Glucose, Bld: 198 mg/dL — ABNORMAL HIGH (ref 70–99)
Potassium: 3.9 mmol/L (ref 3.5–5.1)
Sodium: 141 mmol/L (ref 135–145)
Total Protein: 5.8 g/dL — ABNORMAL LOW (ref 6.5–8.1)

## 2018-08-03 LAB — GLUCOSE, CAPILLARY
GLUCOSE-CAPILLARY: 255 mg/dL — AB (ref 70–99)
GLUCOSE-CAPILLARY: 185 mg/dL — AB (ref 70–99)
Glucose-Capillary: 130 mg/dL — ABNORMAL HIGH (ref 70–99)
Glucose-Capillary: 164 mg/dL — ABNORMAL HIGH (ref 70–99)

## 2018-08-03 LAB — CBC
HCT: 32.6 % — ABNORMAL LOW (ref 39.0–52.0)
Hemoglobin: 10.1 g/dL — ABNORMAL LOW (ref 13.0–17.0)
MCH: 28.5 pg (ref 26.0–34.0)
MCHC: 31 g/dL (ref 30.0–36.0)
MCV: 92.1 fL (ref 80.0–100.0)
Platelets: 157 10*3/uL (ref 150–400)
RBC: 3.54 MIL/uL — ABNORMAL LOW (ref 4.22–5.81)
RDW: 14.4 % (ref 11.5–15.5)
WBC: 7.1 10*3/uL (ref 4.0–10.5)
nRBC: 0 % (ref 0.0–0.2)

## 2018-08-03 MED ORDER — DIPHENHYDRAMINE HCL 12.5 MG/5ML PO ELIX: 12.5000 mg | ORAL_SOLUTION | Freq: Four times a day (QID) | ORAL | Status: DC | PRN

## 2018-08-03 MED ORDER — OXYCODONE-ACETAMINOPHEN 5-325 MG PO TABS
2.0000 | ORAL_TABLET | ORAL | Status: DC | PRN
  Administered 2018-08-03 – 2018-08-05 (×3): 2 via ORAL
  Filled 2018-08-03 (×3): qty 2

## 2018-08-03 MED ORDER — BELLADONNA ALKALOIDS-OPIUM 16.2-60 MG RE SUPP
1.0000 | Freq: Four times a day (QID) | RECTAL | Status: DC | PRN
  Administered 2018-08-03: 1 via RECTAL
  Filled 2018-08-03 (×2): qty 1

## 2018-08-03 MED ORDER — SODIUM CHLORIDE 0.9 % IR SOLN
3000.0000 mL | Status: DC
  Administered 2018-08-03 – 2018-08-05 (×2): 3000 mL

## 2018-08-03 MED ORDER — HYDROMORPHONE HCL 1 MG/ML IJ SOLN
1.0000 mg | INTRAMUSCULAR | Status: DC | PRN
  Administered 2018-08-03 – 2018-08-04 (×3): 1 mg via INTRAVENOUS
  Filled 2018-08-03 (×3): qty 1

## 2018-08-03 MED ORDER — DIPHENHYDRAMINE HCL 50 MG/ML IJ SOLN: 12.5000 mg | Freq: Four times a day (QID) | INTRAMUSCULAR | Status: DC | PRN

## 2018-08-03 MED ORDER — ONDANSETRON HCL 4 MG/2ML IJ SOLN: 4.0000 mg | INTRAMUSCULAR | Status: DC | PRN

## 2018-08-03 MED ORDER — SODIUM CHLORIDE 0.9 % IV SOLN
INTRAVENOUS | Status: DC
  Administered 2018-08-03 – 2018-08-05 (×5): via INTRAVENOUS

## 2018-08-03 MED ORDER — ZOLPIDEM TARTRATE 5 MG PO TABS: 5.0000 mg | ORAL_TABLET | Freq: Every evening | ORAL | Status: DC | PRN

## 2018-08-03 MED ORDER — SODIUM CHLORIDE 0.9 % IV SOLN
2.0000 g | Freq: Every day | INTRAVENOUS | Status: DC
  Administered 2018-08-03 – 2018-08-04 (×3): 2 g via INTRAVENOUS
  Filled 2018-08-03: qty 2
  Filled 2018-08-03: qty 20
  Filled 2018-08-03 (×2): qty 2

## 2018-08-03 NOTE — Progress Notes (Signed)
  Subjective: The foley clotted this morning and was replaced. Urine is now light pink on slow drip CBI  Objective: Vital signs in last 24 hours: Temp:  [98 F (36.7 C)-99.2 F (37.3 C)] 99.2 F (37.3 C) (03/28 2025) Pulse Rate:  [67-83] 69 (03/28 2025) Resp:  [16-18] 16 (03/28 2025) BP: (93-152)/(54-95) 93/54 (03/28 2025) SpO2:  [97 %-100 %] 98 % (03/28 2025) Weight:  [111.1 kg] 111.1 kg (03/27 2345)  Intake/Output from previous day: 03/27 0701 - 03/28 0700 In: 5919.6 [P.O.:720; I.V.:349.6; IV Piggyback:100] Out: 3350 [Urine:3350] Intake/Output this shift: No intake/output data recorded.  Physical Exam:  General:alert, cooperative and appears stated age GI: soft, non tender, normal bowel sounds, no palpable masses, no organomegaly, no inguinal hernia Male genitalia: not done Extremities: extremities normal, atraumatic, no cyanosis or edema  Lab Results: Recent Labs    08/02/18 2014 08/03/18 0456  HGB 10.6* 10.1*  HCT 34.8* 32.6*   BMET Recent Labs    08/02/18 2014 08/03/18 0456  NA 141 141  K 3.9 3.9  CL 109 110  CO2 23 22  GLUCOSE 226* 198*  BUN 16 14  CREATININE 0.96 0.93  CALCIUM 8.6* 8.1*   No results for input(s): LABPT, INR in the last 72 hours. No results for input(s): LABURIN in the last 72 hours. No results found for this or any previous visit.  Studies/Results: No results found.  Assessment/Plan: 57yo with gross hematuria  1. Continue to wean CBI 2.Continue rocephin 3. Activity ad lib   LOS: 0 days   Nicolette Bang 08/03/2018, 8:35 PM

## 2018-08-03 NOTE — Progress Notes (Signed)
Hand irrigated foley and there were several medium sided clots and a large clot. The foley bag was exchanged. Patient had instant relief. Patient did not want any analgesics at this time.

## 2018-08-03 NOTE — Plan of Care (Signed)

## 2018-08-04 LAB — CBC
HCT: 33.2 % — ABNORMAL LOW (ref 39.0–52.0)
Hemoglobin: 10.3 g/dL — ABNORMAL LOW (ref 13.0–17.0)
MCH: 28.5 pg (ref 26.0–34.0)
MCHC: 31 g/dL (ref 30.0–36.0)
MCV: 92 fL (ref 80.0–100.0)
NRBC: 0 % (ref 0.0–0.2)
Platelets: 162 10*3/uL (ref 150–400)
RBC: 3.61 MIL/uL — ABNORMAL LOW (ref 4.22–5.81)
RDW: 14.4 % (ref 11.5–15.5)
WBC: 7.7 10*3/uL (ref 4.0–10.5)

## 2018-08-04 LAB — GLUCOSE, CAPILLARY
GLUCOSE-CAPILLARY: 229 mg/dL — AB (ref 70–99)
Glucose-Capillary: 243 mg/dL — ABNORMAL HIGH (ref 70–99)
Glucose-Capillary: 198 mg/dL — ABNORMAL HIGH (ref 70–99)
Glucose-Capillary: 264 mg/dL — ABNORMAL HIGH (ref 70–99)

## 2018-08-04 LAB — COMPREHENSIVE METABOLIC PANEL
ALT: 31 U/L (ref 0–44)
AST: 19 U/L (ref 15–41)
Albumin: 3 g/dL — ABNORMAL LOW (ref 3.5–5.0)
Alkaline Phosphatase: 36 U/L — ABNORMAL LOW (ref 38–126)
Anion gap: 8 (ref 5–15)
BUN: 14 mg/dL (ref 6–20)
CO2: 23 mmol/L (ref 22–32)
Calcium: 7.8 mg/dL — ABNORMAL LOW (ref 8.9–10.3)
Chloride: 107 mmol/L (ref 98–111)
Creatinine, Ser: 0.93 mg/dL (ref 0.61–1.24)
Glucose, Bld: 226 mg/dL — ABNORMAL HIGH (ref 70–99)
Potassium: 3.8 mmol/L (ref 3.5–5.1)
Sodium: 138 mmol/L (ref 135–145)
Total Bilirubin: 0.4 mg/dL (ref 0.3–1.2)
Total Protein: 5.8 g/dL — ABNORMAL LOW (ref 6.5–8.1)

## 2018-08-04 MED ORDER — FINASTERIDE 5 MG PO TABS
5.0000 mg | ORAL_TABLET | Freq: Every day | ORAL | Status: DC
  Administered 2018-08-04 – 2018-08-05 (×2): 5 mg via ORAL
  Filled 2018-08-04 (×2): qty 1

## 2018-08-04 NOTE — Progress Notes (Signed)
  Subjective: Patient required hand irrgation this morning for a few small clots Urine is now light pink on slow drip CBI  Objective: Vital signs in last 24 hours: Temp:  [98.5 F (36.9 C)-99.2 F (37.3 C)] 99.2 F (37.3 C) (03/29 1812) Pulse Rate:  [67-75] 75 (03/29 1812) Resp:  [16-19] 19 (03/29 1812) BP: (93-128)/(54-85) 127/69 (03/29 1812) SpO2:  [98 %-99 %] 98 % (03/29 1812)  Intake/Output from previous day: 03/28 0701 - 03/29 0700 In: 29790.2 [P.O.:990; I.V.:1700.2; IV Piggyback:100] Out: 42395 [Urine:24750] Intake/Output this shift: Total I/O In: 3536.3 [P.O.:480; I.V.:56.3; Other:3000] Out: 9675 [Urine:9675]  Physical Exam:  General:alert, cooperative and appears stated age GI: soft, non tender, normal bowel sounds, no palpable masses, no organomegaly, no inguinal hernia Male genitalia: not done Extremities: extremities normal, atraumatic, no cyanosis or edema  Lab Results: Recent Labs    08/02/18 2014 08/03/18 0456 08/04/18 0346  HGB 10.6* 10.1* 10.3*  HCT 34.8* 32.6* 33.2*   BMET Recent Labs    08/03/18 0456 08/04/18 0346  NA 141 138  K 3.9 3.8  CL 110 107  CO2 22 23  GLUCOSE 198* 226*  BUN 14 14  CREATININE 0.93 0.93  CALCIUM 8.1* 7.8*   No results for input(s): LABPT, INR in the last 72 hours. No results for input(s): LABURIN in the last 72 hours. No results found for this or any previous visit.  Studies/Results: No results found.  Assessment/Plan: 58yo with gross hematuria  1. Continue to wean CBI 2.add finasteride 5mg  daily   LOS: 1 day   Nicolette Bang 08/04/2018, 6:33 PM

## 2018-08-04 NOTE — Progress Notes (Signed)
Foley was hand irrigated, yielded several medium sized clots, and foley bag exchanged. Patient was given an analgesic and had relief from pain in bladder. Will continue to monitor the patient.

## 2018-08-04 NOTE — Plan of Care (Signed)
  Problem: Clinical Measurements: Goal: Diagnostic test results will improve Outcome: Progressing   Problem: Pain Managment: Goal: General experience of comfort will improve Outcome: Progressing   Problem: Safety: Goal: Ability to remain free from injury will improve Outcome: Progressing

## 2018-08-05 LAB — CBC
HCT: 32.1 % — ABNORMAL LOW (ref 39.0–52.0)
HEMOGLOBIN: 10.1 g/dL — AB (ref 13.0–17.0)
MCH: 28.9 pg (ref 26.0–34.0)
MCHC: 31.5 g/dL (ref 30.0–36.0)
MCV: 91.7 fL (ref 80.0–100.0)
Platelets: 170 10*3/uL (ref 150–400)
RBC: 3.5 MIL/uL — ABNORMAL LOW (ref 4.22–5.81)
RDW: 14.3 % (ref 11.5–15.5)
WBC: 7.9 10*3/uL (ref 4.0–10.5)
nRBC: 0 % (ref 0.0–0.2)

## 2018-08-05 LAB — COMPREHENSIVE METABOLIC PANEL
ALT: 26 U/L (ref 0–44)
AST: 14 U/L — ABNORMAL LOW (ref 15–41)
Albumin: 3.1 g/dL — ABNORMAL LOW (ref 3.5–5.0)
Alkaline Phosphatase: 35 U/L — ABNORMAL LOW (ref 38–126)
Anion gap: 9 (ref 5–15)
BUN: 13 mg/dL (ref 6–20)
CO2: 23 mmol/L (ref 22–32)
Calcium: 8 mg/dL — ABNORMAL LOW (ref 8.9–10.3)
Chloride: 107 mmol/L (ref 98–111)
Creatinine, Ser: 0.93 mg/dL (ref 0.61–1.24)
GFR calc Af Amer: >60 mL/min (ref >60)
GFR calc non Af Amer: >60 mL/min (ref >60)
GLUCOSE: 202 mg/dL — AB (ref 70–99)
Potassium: 3.8 mmol/L (ref 3.5–5.1)
Sodium: 139 mmol/L (ref 135–145)
Total Bilirubin: 0.7 mg/dL (ref 0.3–1.2)
Total Protein: 5.9 g/dL — ABNORMAL LOW (ref 6.5–8.1)

## 2018-08-05 LAB — GLUCOSE, CAPILLARY
Glucose-Capillary: 203 mg/dL — ABNORMAL HIGH (ref 70–99)
Glucose-Capillary: 283 mg/dL — ABNORMAL HIGH (ref 70–99)

## 2018-08-05 MED ORDER — FINASTERIDE 5 MG PO TABS: 5.0000 mg | ORAL_TABLET | Freq: Every day | ORAL | 11 refills | Status: AC

## 2018-08-05 NOTE — Progress Notes (Signed)
Patient have been voiding clear yellow urine since foley was taken out at 10am this morning by doctor [place]. Patient denies any pain/distress. Discharge instructions given and explained to patient and he verbalized understanding also reinforced to pick up his called in prescription.  Patient waiting on his wife to pick him up.

## 2018-08-05 NOTE — Progress Notes (Signed)
Per pt request, foley was hand irrigated. Pt stated he felt strong urge to void. Hand irrigation yielded one medium clot. Pt still uncomfortable. Hand irrigation attempted again with no blood or clots noted. Pt states he still felt uncomfortable and RN, joanne, brought in to attempt irrigation. No further clots noted, pt states relief from back ache. Pt advised to notify this RN if pain or urge persists. Pt resting.

## 2018-08-05 NOTE — Progress Notes (Signed)
Patient accompanied home by wife, in no distress; skin intact, no wound noted.

## 2018-08-05 NOTE — Discharge Instructions (Signed)
1. You may see some blood in the urine and may have some burning with urination for 48-72 hours. You also may notice that you have to urinate more frequently or urgently after your procedure which is normal.  2. You should call should you develop an inability urinate, fever > 101, persistent nausea and vomiting that prevents you from eating or drinking to stay hydrated.  3.   Stop aspirin for 3-4 days       4.   Continue cipro til all gone

## 2018-08-27 NOTE — Discharge Summary (Signed)
Patient ID: Robert Moore MRN: [id number] DOB/AGE: April 19, 1961 58 y.o.  Admit date: 08/02/2018 Discharge date: 08/27/2018  Primary Care Physician:  Amador Cunas, FNP  Discharge Diagnoses:  Urinary retention Gross hematuria Present on Admission: . Gross hematuria Urinary retention  Consults:  None   Discharge Medications: Allergies as of 08/05/2018   No Known Allergies     Medication List    STOP taking these medications   amoxicillin 875 MG tablet Commonly known as:  AMOXIL   aspirin EC 81 MG tablet   oxyCODONE-acetaminophen 5-325 MG tablet Commonly known as:  Roxicet     TAKE these medications   Basaglar KwikPen 100 UNIT/ML Sopn Inject 50 Units into the skin 2 (two) times daily.   buPROPion 300 MG 24 hr tablet Commonly known as:  WELLBUTRIN XL Take 300 mg by mouth every morning.   doxazosin 8 MG tablet Commonly known as:  CARDURA Take 8 mg by mouth daily.   finasteride 5 MG tablet Commonly known as:  PROSCAR Take 1 tablet (5 mg total) by mouth daily.   glipiZIDE 10 MG tablet Commonly known as:  GLUCOTROL Take 10 mg by mouth 2 (two) times daily before a meal.   losartan 100 MG tablet Commonly known as:  COZAAR Take 100 mg by mouth daily.   metFORMIN 500 MG tablet Commonly known as:  GLUCOPHAGE Take 1,000 mg by mouth 2 (two) times daily with a meal.   multivitamin with minerals Tabs tablet Take 1 tablet by mouth daily.   rosuvastatin 10 MG tablet Commonly known as:  CRESTOR Take 10 mg by mouth daily.     ASK your doctor about these medications   ciprofloxacin 500 MG tablet Commonly known as:  CIPRO Take 500 mg by mouth 2 (two) times daily. Ask about: Should I take this medication?        Significant Diagnostic Studies:  No results found.  Brief H and P: For complete details please refer to admission H and P, but in brief the patient was admitted for management of urinary retention and gross hematuria  Hospital Course:  The patient  underwent bladder irrigation following proper Foley catheter placement.  It was felt that some of the patient's problem was from traumatic catheter placement in the emergency room.  Following bladder irrigation, the urine cleared.  The patient voided after the catheter was removed, and was discharged home in improved condition Active Problems:   Gross hematuria   Day of Discharge BP 125/65   Pulse 64   Temp 98.3 F (36.8 C) (Oral)   Resp 14   Ht 5\' 10"  (1.778 m)   Wt 111.1 kg   SpO2 96%   BMI 35.15 kg/m   No results found for this or any previous visit (from the past 24 hour(s)).  Physical Exam: General: Alert and awake oriented x3 not in any acute distress. HEENT: anicteric sclera, pupils reactive to light and accommodation CVS: S1-S2 clear no murmur rubs or gallops Chest: clear to auscultation bilaterally, no wheezing rales or rhonchi Abdomen: soft nontender, nondistended, normal bowel sounds, no organomegaly Extremities: no cyanosis, clubbing or edema noted bilaterally Neuro: Cranial nerves II-XII intact, no focal neurological deficits  Disposition:   home  Diet:   regular  Activity:   Discussed with the patient   Disposition and Follow-up:      Office follow-up will be arranged   [place], [place], [place] .   Specialty:  Family Medicine  Contact information: [address] [place] [address] [phone]        Franchot Gallo, MD Follow up.   Specialty:  Urology Why:  We will call you to schedule appt Contact information: [place] [address] [phone]           Time spent on Discharge:    15 minutes  Signed: Lillette Boxer Hyman Crossan 08/27/2018, 2:20 PM

## 2019-08-07 ENCOUNTER — Ambulatory Visit: Payer: Managed Care, Other (non HMO) | Attending: Internal Medicine

## 2019-08-07 DIAGNOSIS — Z23 Encounter for immunization: Secondary | ICD-10-CM

## 2019-08-07 NOTE — Progress Notes (Signed)
   Covid-19 Vaccination Clinic  Name:  MUZZAMMIL STANDISH    MRN: [id number] DOB: 1960/06/23  08/07/2019  Mr. Hood was observed post Covid-19 immunization for 15 minutes without incident. He was provided with Vaccine Information Sheet and instruction to access the V-Safe system.   Mr. Elzinga was instructed to call 911 with any severe reactions post vaccine: Marland Kitchen Difficulty breathing  . Swelling of face and throat  . A fast heartbeat  . A bad rash all over body  . Dizziness and weakness   Immunizations Administered    Name Date Dose VIS Date Route   Pfizer COVID-19 Vaccine 08/07/2019  4:11 PM 0.3 mL 04/18/2019 Intramuscular   Manufacturer: [employer], [employer]   Lot: [id number]   [place]: [id number]

## 2019-09-01 ENCOUNTER — Ambulatory Visit: Payer: Managed Care, Other (non HMO) | Attending: Internal Medicine

## 2019-09-01 DIAGNOSIS — Z23 Encounter for immunization: Secondary | ICD-10-CM

## 2019-09-01 NOTE — Progress Notes (Signed)
   Covid-19 Vaccination Clinic  Name:  Robert Moore    MRN: [id number] DOB: 16-Apr-1961  09/01/2019  [name] was observed post Covid-19 immunization for 15 minutes without incident. He was provided with Vaccine Information Sheet and instruction to access the V-Safe system.   [name] was instructed to call 911 with any severe reactions post vaccine: Marland Kitchen Difficulty breathing  . Swelling of face and throat  . A fast heartbeat  . A bad rash all over body  . Dizziness and weakness   Immunizations Administered    Name Date Dose VIS Date Route   Pfizer COVID-19 Vaccine 09/01/2019  3:45 PM 0.3 mL 07/02/2018 Intramuscular   Manufacturer: [place]   Lot: [id number]   [place]: [id number]

## 2019-11-24 ENCOUNTER — Other Ambulatory Visit: Payer: Self-pay | Admitting: Urology

## 2019-12-05 ENCOUNTER — Other Ambulatory Visit (HOSPITAL_COMMUNITY)
Admission: RE | Admit: 2019-12-05 | Discharge: 2019-12-05 | Disposition: A | Discharge status: Home / Self Care | Payer: Managed Care, Other (non HMO) | Source: Ambulatory Visit | Attending: Urology | Admitting: Urology

## 2019-12-05 DIAGNOSIS — Z20822 Contact with and (suspected) exposure to covid-19: Secondary | ICD-10-CM | POA: Insufficient documentation

## 2019-12-05 DIAGNOSIS — Z01812 Encounter for preprocedural laboratory examination: Secondary | ICD-10-CM | POA: Insufficient documentation

## 2019-12-05 LAB — SARS CORONAVIRUS 2 (TAT 6-24 HRS): SARS Coronavirus 2: NEGATIVE

## 2019-12-05 NOTE — Progress Notes (Signed)
Talked with Patient. Instructions given, Arrival time 0800. NPO after MN except sip of water with AM meds. Driver is wife

## 2019-12-08 ENCOUNTER — Ambulatory Visit (HOSPITAL_COMMUNITY): Payer: Managed Care, Other (non HMO)

## 2019-12-08 ENCOUNTER — Other Ambulatory Visit: Payer: Self-pay

## 2019-12-08 ENCOUNTER — Ambulatory Visit (HOSPITAL_BASED_OUTPATIENT_CLINIC_OR_DEPARTMENT_OTHER)
Admission: RE | Admit: 2019-12-08 | Discharge: 2019-12-08 | Disposition: A | Discharge status: Home / Self Care | Payer: Managed Care, Other (non HMO) | Attending: Urology | Admitting: Urology

## 2019-12-08 ENCOUNTER — Encounter (HOSPITAL_BASED_OUTPATIENT_CLINIC_OR_DEPARTMENT_OTHER): Payer: Self-pay | Admitting: Urology

## 2019-12-08 ENCOUNTER — Encounter (HOSPITAL_BASED_OUTPATIENT_CLINIC_OR_DEPARTMENT_OTHER)
Admission: RE | Disposition: A | Discharge status: Home / Self Care | Payer: Self-pay | Source: Home / Self Care | Attending: Urology

## 2019-12-08 DIAGNOSIS — N5201 Erectile dysfunction due to arterial insufficiency: Secondary | ICD-10-CM | POA: Insufficient documentation

## 2019-12-08 DIAGNOSIS — Z794 Long term (current) use of insulin: Secondary | ICD-10-CM | POA: Diagnosis not present

## 2019-12-08 DIAGNOSIS — I10 Essential (primary) hypertension: Secondary | ICD-10-CM | POA: Diagnosis not present

## 2019-12-08 DIAGNOSIS — R3914 Feeling of incomplete bladder emptying: Secondary | ICD-10-CM | POA: Diagnosis not present

## 2019-12-08 DIAGNOSIS — F329 Major depressive disorder, single episode, unspecified: Secondary | ICD-10-CM | POA: Insufficient documentation

## 2019-12-08 DIAGNOSIS — Z85038 Personal history of other malignant neoplasm of large intestine: Secondary | ICD-10-CM | POA: Insufficient documentation

## 2019-12-08 DIAGNOSIS — E7800 Pure hypercholesterolemia, unspecified: Secondary | ICD-10-CM | POA: Insufficient documentation

## 2019-12-08 DIAGNOSIS — Z79899 Other long term (current) drug therapy: Secondary | ICD-10-CM | POA: Insufficient documentation

## 2019-12-08 DIAGNOSIS — N401 Enlarged prostate with lower urinary tract symptoms: Secondary | ICD-10-CM | POA: Diagnosis not present

## 2019-12-08 DIAGNOSIS — N201 Calculus of ureter: Secondary | ICD-10-CM | POA: Insufficient documentation

## 2019-12-08 HISTORY — PX: EXTRACORPOREAL SHOCK WAVE LITHOTRIPSY: SHX1557

## 2019-12-08 LAB — GLUCOSE, CAPILLARY
Glucose-Capillary: 155 mg/dL — ABNORMAL HIGH (ref 70–99)
Glucose-Capillary: 93 mg/dL (ref 70–99)

## 2019-12-08 IMAGING — DX DG ABDOMEN 1V
2 series · 2 of 2 positions shown · non-contrast
Comparison: [DATE]

CLINICAL DATA: Preop

EXAM:
ABDOMEN - 1 VIEW

[abdomen kub (1 of 2)]
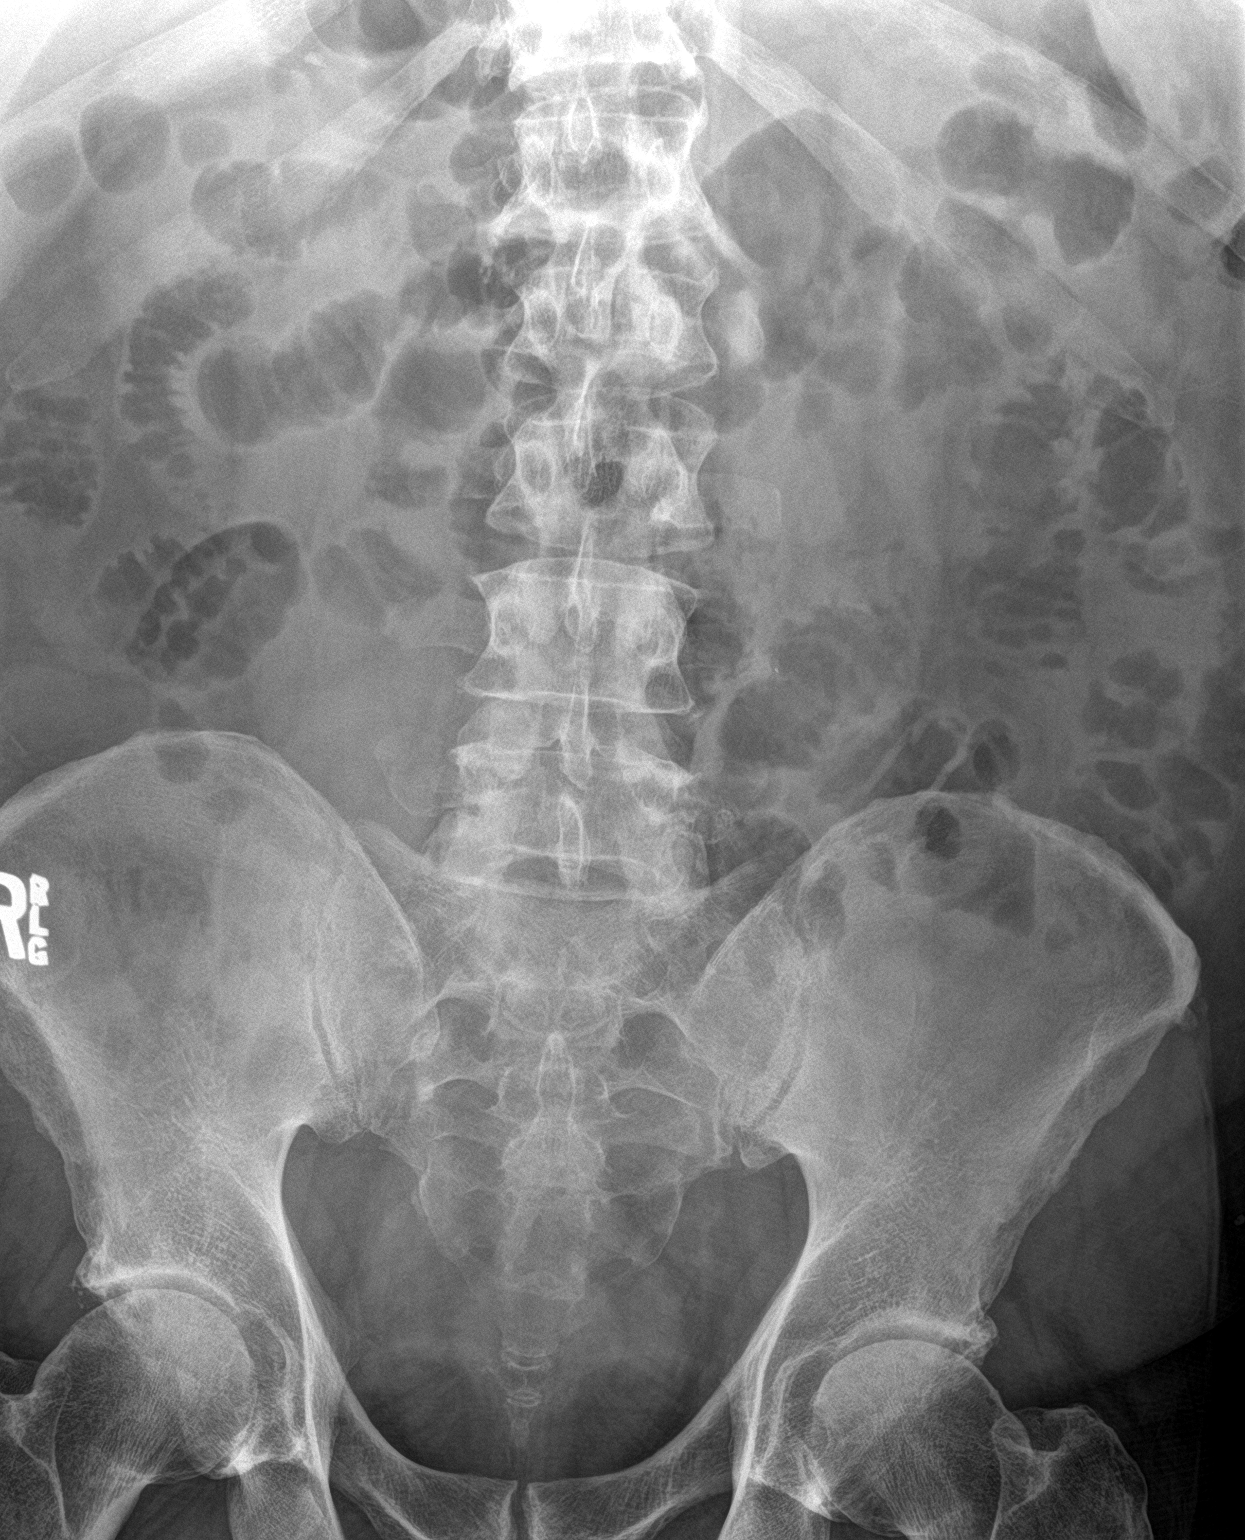

[abdomen kub (2 of 2)]
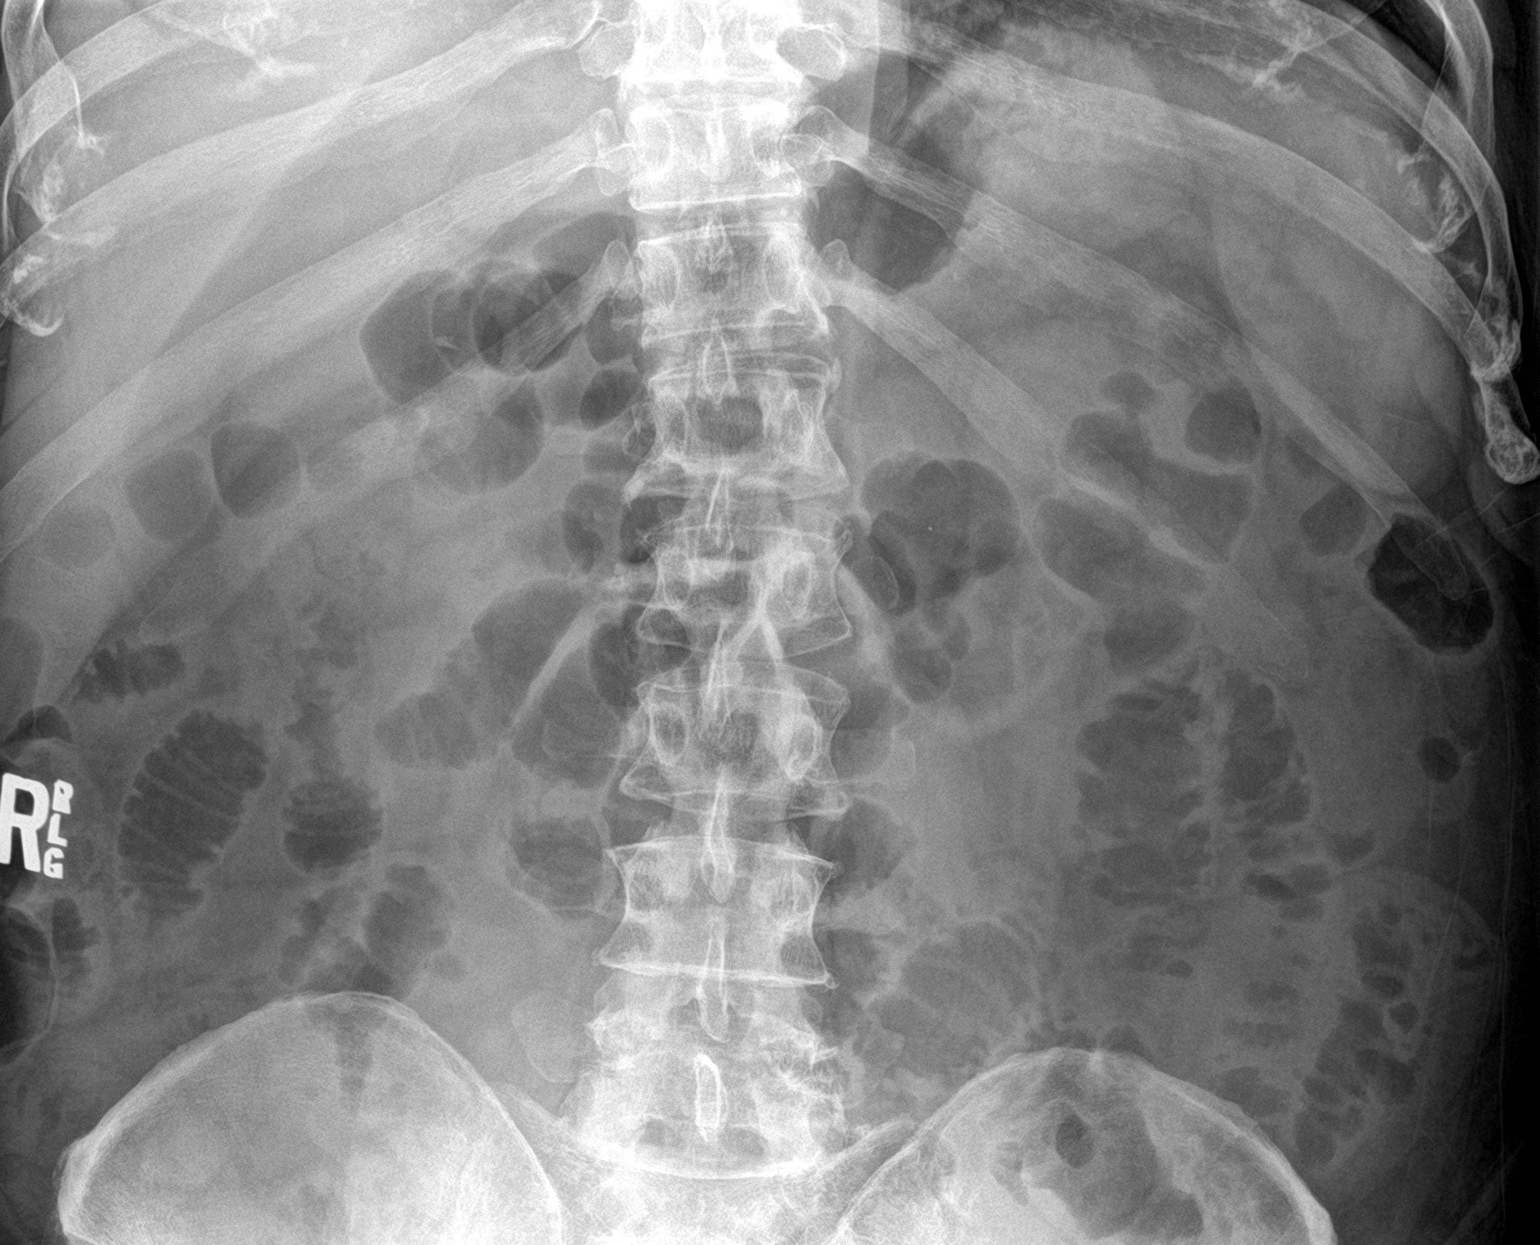

[2 of 2 positions shown; findings below may reference images not displayed]

FINDINGS: Air and stool-filled nondilated loops of bowel. Mild colonic stool
burden diffusely throughout the colon. Incomplete assessment of the
lung bases. There is a 5 mm radiopaque density projecting over the
expected region of the superior pole the RIGHT kidney; however
evaluation is limited secondary to overlying bowel gas. Degenerative
changes of the lumbar spine.
IMPRESSION: 1. A 5 mm radiopaque density projects over the expected region of
the superior pole the RIGHT kidney; this likely reflects a
nephrolithiasis.

## 2019-12-08 SURGERY — LITHOTRIPSY, ESWL
Anesthesia: LOCAL | Laterality: Right

## 2019-12-08 MED ORDER — SODIUM CHLORIDE 0.9 % IV SOLN: INTRAVENOUS | Status: DC

## 2019-12-08 MED ORDER — DIAZEPAM 5 MG PO TABS
10.0000 mg | ORAL_TABLET | ORAL | Status: AC
  Administered 2019-12-08: 10 mg via ORAL

## 2019-12-08 MED ORDER — DIAZEPAM 5 MG PO TABS
ORAL_TABLET | ORAL | Status: AC
  Filled 2019-12-08: qty 2

## 2019-12-08 MED ORDER — CIPROFLOXACIN HCL 500 MG PO TABS
500.0000 mg | ORAL_TABLET | ORAL | Status: AC
  Administered 2019-12-08: 500 mg via ORAL

## 2019-12-08 MED ORDER — CIPROFLOXACIN HCL 500 MG PO TABS
ORAL_TABLET | ORAL | Status: AC
  Filled 2019-12-08: qty 1

## 2019-12-08 MED ORDER — DIPHENHYDRAMINE HCL 25 MG PO CAPS
25.0000 mg | ORAL_CAPSULE | ORAL | Status: AC
  Administered 2019-12-08: 25 mg via ORAL

## 2019-12-08 MED ORDER — DIPHENHYDRAMINE HCL 25 MG PO CAPS
ORAL_CAPSULE | ORAL | Status: AC
  Filled 2019-12-08: qty 1

## 2019-12-08 NOTE — Op Note (Signed)
See [facility] OP note scanned into chart. Also because of the size, density, location and other factors that cannot be anticipated I feel this will likely be a staged procedure. This fact supersedes any indication in the scanned [facility] operative note to the contrary.  

## 2019-12-08 NOTE — H&P (Signed)
See scanned H&P

## 2019-12-08 NOTE — Discharge Instructions (Signed)

## 2019-12-09 ENCOUNTER — Encounter (HOSPITAL_BASED_OUTPATIENT_CLINIC_OR_DEPARTMENT_OTHER): Payer: Self-pay | Admitting: Urology

## 2020-10-21 ENCOUNTER — Ambulatory Visit: Payer: Self-pay | Admitting: Surgery

## 2020-10-21 DIAGNOSIS — C439 Malignant melanoma of skin, unspecified: Secondary | ICD-10-CM

## 2020-10-22 NOTE — H&P (View-Only) (Signed)
History of Present Illness (Jeromiah Ohalloran L. Zenia Resides MD; 10/21/2020 9:04 PM) The patient is a 60 year old male who presents for a melanoma biopsy follow-up.HPI: Mr. Helfman is a 60 yo male who was referred with a newly-diagnosed melanoma of the upper back. He has had a mole on the upper back that was first noted about a year ago and he was told to monitor it. He recently had a tangential biopsy done by his dermatologist on 09/21/20. This showed a melanoma, 0.69mm thick with no ulcerations, mitotic index of 0, and no lymphovascular invasion. Peripheral margins were involved and there was focal involvement of the deep margin. He was referred to me to discuss surgery. He reports a lot of sun exposure when he was younger. He has not had any prior skin cancers and does not have a known family history of skin cancer.  PMH: colon cancer, DM, HTN, HLD  PSH: laparoscopic left colectomy (2015 by Dr. Marcello Moores), lithotripsy  FHx: Denies family history of skin cancer.    Allergies Carrolyn Leigh, CMA; 10/21/2020 4:35 PM) No Known Drug Allergies   [03/12/2014]: Allergies Reconciled    Medication History Carrolyn Leigh, CMA; 10/21/2020 4:35 PM) Flonase Allergy Relief  (50MCG/ACT Suspension, Nasal) Active. Omeprazole  (10MG  Capsule DR, Oral) Active. Xanax  (0.25MG  Tablet, Oral) Active. Wellbutrin XL  (300MG  Tablet ER 24HR, Oral) Active. Lantus for OptiClik  (100UNIT/ML Soln Cartridge, Subcutaneous) Active. Lisinopril-Hydrochlorothiazide  (20-25MG  Tablet, Oral) Active. Lovastatin  (40MG  Tablet, Oral) Active. MetFORMIN HCl  (500MG  Tablet, Oral) Active. Multi Vitamin Mens  (Oral) Active. GlipiZIDE ER  (10MG  Tablet ER 24HR, Oral) Active. Rapaflo  (8MG  Capsule, Oral) Active. Medications Reconciled   Vitals Arville Go Fox CMA; 10/21/2020 4:36 PM) 10/21/2020 4:35 PM Weight: 277 lb   Height: 70 in  Body Surface Area: 2.4 m   Body Mass Index: 39.74 kg/m   Temp.: 97.8 F    Pulse: 87 (Regular)    P.OX: 97% (Room air) BP: 144/80(Sitting,  Left Arm, Standard)       Physical Exam ([place] Schweppe L. Zenia Resides MD; 10/21/2020 9:05 PM) The physical exam findings are as follows: Note:  Constitutional: No acute distress; conversant; no deformities Neuro: alert and oriented; cranial nerves grossly in tact; no focal deficits Eyes: Moist conjunctiva; anicteric sclerae; extraocular movements in tact Neck: Trachea midline Lungs: Normal respiratory effort; lungs clear to auscultation bilaterally; symmetric chest wall expansion CV: Regular rate and rhythm; no murmurs; no pitting edema MSK: Normal gait and station; no clubbing/cyanosis Psychiatric: Appropriate affect; alert and oriented 3 Lymphatic: No palpable cervical, supraclavicular or axillary lymphadenopathy Skin: recent biopsy site on right posterior shoulder at the base of the neck is healing well    Assessment & Plan ([place] L. 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 No high risk features (no ulceration or lymphovascular invasion), but since the deep margin was positive the true depth is unclear. Thus I recommend a wide local excision with sentinel lymph node biopsy. Sentinel nodes will most likely be cervical and/or supraclavicular nodes, but there may also be axillary sentinel nodes. Will need to schedule node mapping prior to surgery. I discussed the details of the surgery with the patient and the expected postoperative restrictions. Will schedule him for surgery next week on 6/23.  Michaelle Birks, MD [facility] Surgery General, Hepatobiliary and Pancreatic Surgery 10/22/20 7:28 AM

## 2020-10-22 NOTE — H&P (Signed)
History of Present Illness (Keri Tavella L. Zenia Resides MD; 10/21/2020 9:04 PM) The patient is a 60 year old male who presents for a melanoma biopsy follow-up.HPI: Mr. Sagar is a 60 yo male who was referred with a newly-diagnosed melanoma of the upper back. He has had a mole on the upper back that was first noted about a year ago and he was told to monitor it. He recently had a tangential biopsy done by his dermatologist on 09/21/20. This showed a melanoma, 0.59mm thick with no ulcerations, mitotic index of 0, and no lymphovascular invasion. Peripheral margins were involved and there was focal involvement of the deep margin. He was referred to me to discuss surgery. He reports a lot of sun exposure when he was younger. He has not had any prior skin cancers and does not have a known family history of skin cancer.  PMH: colon cancer, DM, HTN, HLD  PSH: laparoscopic left colectomy (2015 by Dr. Marcello Moores), lithotripsy  FHx: Denies family history of skin cancer.    Allergies Carrolyn Leigh, CMA; 10/21/2020 4:35 PM) No Known Drug Allergies   [03/12/2014]: Allergies Reconciled    Medication History Carrolyn Leigh, CMA; 10/21/2020 4:35 PM) Flonase Allergy Relief  (50MCG/ACT Suspension, Nasal) Active. Omeprazole  (10MG  Capsule DR, Oral) Active. Xanax  (0.25MG  Tablet, Oral) Active. Wellbutrin XL  (300MG  Tablet ER 24HR, Oral) Active. Lantus for OptiClik  (100UNIT/ML Soln Cartridge, Subcutaneous) Active. Lisinopril-Hydrochlorothiazide  (20-25MG  Tablet, Oral) Active. Lovastatin  (40MG  Tablet, Oral) Active. MetFORMIN HCl  (500MG  Tablet, Oral) Active. Multi Vitamin Mens  (Oral) Active. GlipiZIDE ER  (10MG  Tablet ER 24HR, Oral) Active. Rapaflo  (8MG  Capsule, Oral) Active. Medications Reconciled   Vitals Arville Go Fox CMA; 10/21/2020 4:36 PM) 10/21/2020 4:35 PM Weight: 277 lb   Height: 70 in  Body Surface Area: 2.4 m   Body Mass Index: 39.74 kg/m   Temp.: 97.8 F    Pulse: 87 (Regular)    P.OX: 97% (Room air) BP: 144/80(Sitting,  Left Arm, Standard)       Physical Exam (Jamilet Ambroise L. Zenia Resides MD; 10/21/2020 9:05 PM) The physical exam findings are as follows: Note:  Constitutional: No acute distress; conversant; no deformities Neuro: alert and oriented; cranial nerves grossly in tact; no focal deficits Eyes: Moist conjunctiva; anicteric sclerae; extraocular movements in tact Neck: Trachea midline Lungs: Normal respiratory effort; lungs clear to auscultation bilaterally; symmetric chest wall expansion CV: Regular rate and rhythm; no murmurs; no pitting edema MSK: Normal gait and station; no clubbing/cyanosis Psychiatric: Appropriate affect; alert and oriented 3 Lymphatic: No palpable cervical, supraclavicular or axillary lymphadenopathy Skin: recent biopsy site on right posterior shoulder at the base of the neck is healing well    Assessment & Plan ([place] L. 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 No high risk features (no ulceration or lymphovascular invasion), but since the deep margin was positive the true depth is unclear. Thus I recommend a wide local excision with sentinel lymph node biopsy. Sentinel nodes will most likely be cervical and/or supraclavicular nodes, but there may also be axillary sentinel nodes. Will need to schedule node mapping prior to surgery. I discussed the details of the surgery with the patient and the expected postoperative restrictions. Will schedule him for surgery next week on 6/23.  Michaelle Birks, MD [facility] Surgery General, Hepatobiliary and Pancreatic Surgery 10/22/20 7:28 AM

## 2020-10-25 ENCOUNTER — Other Ambulatory Visit (HOSPITAL_COMMUNITY): Payer: Self-pay | Admitting: Surgery

## 2020-10-25 DIAGNOSIS — C187 Malignant neoplasm of sigmoid colon: Secondary | ICD-10-CM

## 2020-10-26 ENCOUNTER — Other Ambulatory Visit: Payer: Self-pay

## 2020-10-26 ENCOUNTER — Ambulatory Visit (HOSPITAL_COMMUNITY)
Admission: RE | Admit: 2020-10-26 | Discharge: 2020-10-26 | Disposition: A | Discharge status: Home / Self Care | Payer: Managed Care, Other (non HMO) | Source: Ambulatory Visit | Attending: Surgery | Admitting: Surgery

## 2020-10-26 DIAGNOSIS — C187 Malignant neoplasm of sigmoid colon: Secondary | ICD-10-CM | POA: Diagnosis present

## 2020-10-26 MED ORDER — TECHNETIUM TC 99M TILMANOCEPT KIT
1.0000 | PACK | Freq: Once | INTRAVENOUS | Status: AC | PRN
  Administered 2020-10-26: 1 via INTRADERMAL

## 2020-10-27 ENCOUNTER — Other Ambulatory Visit: Payer: Self-pay

## 2020-10-27 ENCOUNTER — Other Ambulatory Visit: Payer: Self-pay | Admitting: Surgery

## 2020-10-27 ENCOUNTER — Encounter (HOSPITAL_COMMUNITY): Payer: Self-pay | Admitting: Surgery

## 2020-10-27 ENCOUNTER — Other Ambulatory Visit (HOSPITAL_COMMUNITY): Payer: Self-pay | Admitting: Surgery

## 2020-10-27 DIAGNOSIS — C4361 Malignant melanoma of right upper limb, including shoulder: Secondary | ICD-10-CM

## 2020-10-27 NOTE — Progress Notes (Signed)
PCP - Amador Cunas, FNP Cardiologist - denies EKG - DOS Chest x-ray -  ECHO -  Cardiac Cath -  CPAP -   Fasting Blood Sugar:  90-140 Checks Blood Sugar:  1x/day  ERAS Protcol - yes clars 1200  COVID TEST- n/a  Anesthesia review: yes- mumur - per pt PCP unconcerned and has not heard since he was a young child  -------------  SDW INSTRUCTIONS:  Your procedure is scheduled on 6/23. Please report to [facility] Entrance "A" at 12:30 P.M., and check in at the Admitting office. Call this number if you have problems the morning of surgery: [phone]   Remember: Do not eat after midnight the night before your surgery  You may drink clear liquids until 12:00 day of your surgery.   Clear liquids allowed are: Water, Non-Citrus Juices (without pulp), Carbonated Beverages, Clear Tea, Black Coffee Only, and Gatorade   Medications to take morning of surgery with a sip of water include: ALPRAZolam Duanne Moron) if needed buPROPion (WELLBUTRIN XL)   glipiZIDE (GLUCOTROL)  6/22- no bedtime dose 6/23 - none  Insulin Glargine (BASAGLAR KWIKPEN)  6/22: 32.5 units bedtime 6/23: 32.5 units DOS   metFORMIN (GLUCOPHAGE)  6/22: take as usual 6/23: none   No oral diabetic medications DOS  ** PLEASE check your blood sugar the morning of your surgery when you wake up and every 2 hours until you get to the Short Stay unit.  If your blood sugar is less than 70 mg/dL, you will need to treat for low blood sugar: Do not take insulin. Treat a low blood sugar (less than 70 mg/dL) with  cup of clear juice (cranberry or apple), 4 glucose tablets, OR glucose gel. Recheck blood sugar in 15 minutes after treatment (to make sure it is greater than 70 mg/dL). If your blood sugar is not greater than 70 mg/dL on recheck, call [phone] for further instructions.   As of today, STOP taking any Aspirin (unless otherwise instructed by your surgeon), Aleve, Naproxen, Ibuprofen, Motrin, Advil, Goody's, BC's,  all herbal medications, fish oil, and all vitamins.    The Morning of Surgery Do not wear jewelry Do not wear lotions, powders, colognes, or deodorant Do not shave 48 hours prior to surgery.   Men may shave face and neck. Do not bring valuables to the hospital. [facility] is not responsible for any belongings or valuables.  If you are a smoker, DO NOT Smoke 24 hours prior to surgery If you wear a CPAP at night please bring your mask the morning of surgery  Remember that you must have someone to transport you home after your surgery, and remain with you for 24 hours if you are discharged the same day.  Please bring cases for contacts, glasses, hearing aids, dentures or bridgework because it cannot be worn into surgery.   Patients discharged the day of surgery will not be allowed to drive home.   Please shower the NIGHT BEFORE/MORNING OF SURGERY (use antibacterial soap like DIAL soap if possible). Wear comfortable clothes the morning of surgery. Oral Hygiene is also important to reduce your risk of infection.  Remember - BRUSH YOUR TEETH THE MORNING OF SURGERY WITH YOUR REGULAR TOOTHPASTE  Patient denies shortness of breath, fever, cough and chest pain.

## 2020-10-28 ENCOUNTER — Ambulatory Visit (HOSPITAL_COMMUNITY): Payer: Managed Care, Other (non HMO) | Admitting: Physician Assistant

## 2020-10-28 ENCOUNTER — Ambulatory Visit (HOSPITAL_COMMUNITY)
Admission: RE | Admit: 2020-10-28 | Discharge: 2020-10-28 | Disposition: A | Discharge status: Home / Self Care | Payer: Managed Care, Other (non HMO) | Source: Ambulatory Visit | Attending: Surgery | Admitting: Surgery

## 2020-10-28 ENCOUNTER — Other Ambulatory Visit: Payer: Self-pay

## 2020-10-28 ENCOUNTER — Encounter (HOSPITAL_COMMUNITY): Payer: Self-pay | Admitting: Surgery

## 2020-10-28 ENCOUNTER — Encounter (HOSPITAL_COMMUNITY)
Admission: RE | Disposition: A | Discharge status: Home / Self Care | Payer: Self-pay | Source: Home / Self Care | Attending: Surgery

## 2020-10-28 ENCOUNTER — Ambulatory Visit (HOSPITAL_COMMUNITY)
Admission: RE | Admit: 2020-10-28 | Discharge: 2020-10-28 | Disposition: A | Discharge status: Home / Self Care | Payer: Managed Care, Other (non HMO) | Attending: Surgery | Admitting: Surgery

## 2020-10-28 ENCOUNTER — Encounter (HOSPITAL_COMMUNITY): Payer: Self-pay

## 2020-10-28 ENCOUNTER — Ambulatory Visit (HOSPITAL_COMMUNITY): Payer: Managed Care, Other (non HMO)

## 2020-10-28 DIAGNOSIS — Z7984 Long term (current) use of oral hypoglycemic drugs: Secondary | ICD-10-CM | POA: Diagnosis not present

## 2020-10-28 DIAGNOSIS — Z9049 Acquired absence of other specified parts of digestive tract: Secondary | ICD-10-CM | POA: Diagnosis not present

## 2020-10-28 DIAGNOSIS — C4361 Malignant melanoma of right upper limb, including shoulder: Secondary | ICD-10-CM | POA: Insufficient documentation

## 2020-10-28 DIAGNOSIS — Z79899 Other long term (current) drug therapy: Secondary | ICD-10-CM | POA: Diagnosis not present

## 2020-10-28 DIAGNOSIS — Z794 Long term (current) use of insulin: Secondary | ICD-10-CM | POA: Insufficient documentation

## 2020-10-28 DIAGNOSIS — Z85038 Personal history of other malignant neoplasm of large intestine: Secondary | ICD-10-CM | POA: Diagnosis not present

## 2020-10-28 DIAGNOSIS — C434 Malignant melanoma of scalp and neck: Secondary | ICD-10-CM | POA: Insufficient documentation

## 2020-10-28 HISTORY — PX: MELANOMA EXCISION: SHX5266

## 2020-10-28 LAB — CBC
HCT: 40.9 % (ref 39.0–52.0)
Hemoglobin: 13.2 g/dL (ref 13.0–17.0)
MCH: 28.9 pg (ref 26.0–34.0)
MCHC: 32.3 g/dL (ref 30.0–36.0)
MCV: 89.5 fL (ref 80.0–100.0)
Platelets: 212 10*3/uL (ref 150–400)
RBC: 4.57 MIL/uL (ref 4.22–5.81)
RDW: 13.3 % (ref 11.5–15.5)
WBC: 6.8 10*3/uL (ref 4.0–10.5)
nRBC: 0 % (ref 0.0–0.2)

## 2020-10-28 LAB — BASIC METABOLIC PANEL
Anion gap: 9 (ref 5–15)
BUN: 15 mg/dL (ref 6–20)
CO2: 22 mmol/L (ref 22–32)
Calcium: 8.9 mg/dL (ref 8.9–10.3)
Chloride: 109 mmol/L (ref 98–111)
Creatinine, Ser: 1.37 mg/dL — ABNORMAL HIGH (ref 0.61–1.24)
GFR, Estimated: 59 mL/min — ABNORMAL LOW (ref >60)
Glucose, Bld: 118 mg/dL — ABNORMAL HIGH (ref 70–99)
Potassium: 4 mmol/L (ref 3.5–5.1)
Sodium: 140 mmol/L (ref 135–145)

## 2020-10-28 LAB — GLUCOSE, CAPILLARY
Glucose-Capillary: 116 mg/dL — ABNORMAL HIGH (ref 70–99)
Glucose-Capillary: 103 mg/dL — ABNORMAL HIGH (ref 70–99)

## 2020-10-28 SURGERY — EXCISION, MELANOMA
Anesthesia: General | Site: Shoulder | Laterality: Right

## 2020-10-28 MED ORDER — FENTANYL CITRATE (PF) 100 MCG/2ML IJ SOLN
INTRAMUSCULAR | Status: DC | PRN
  Administered 2020-10-28: 100 ug via INTRAVENOUS
  Administered 2020-10-28: 50 ug via INTRAVENOUS
  Administered 2020-10-28: 100 ug via INTRAVENOUS

## 2020-10-28 MED ORDER — GABAPENTIN 300 MG PO CAPS
ORAL_CAPSULE | ORAL | Status: AC
  Administered 2020-10-28: 300 mg via ORAL
  Filled 2020-10-28: qty 1

## 2020-10-28 MED ORDER — MIDAZOLAM HCL 2 MG/2ML IJ SOLN
INTRAMUSCULAR | Status: AC
  Filled 2020-10-28: qty 2

## 2020-10-28 MED ORDER — PHENYLEPHRINE HCL (PRESSORS) 10 MG/ML IV SOLN
INTRAVENOUS | Status: AC
  Filled 2020-10-28: qty 1

## 2020-10-28 MED ORDER — ROCURONIUM BROMIDE 100 MG/10ML IV SOLN
INTRAVENOUS | Status: DC | PRN
  Administered 2020-10-28: 60 mg via INTRAVENOUS

## 2020-10-28 MED ORDER — SUGAMMADEX SODIUM 500 MG/5ML IV SOLN
INTRAVENOUS | Status: AC
  Filled 2020-10-28: qty 5

## 2020-10-28 MED ORDER — LIDOCAINE HCL 1 % IJ SOLN
INTRAMUSCULAR | Status: DC | PRN
  Administered 2020-10-28: 20 mL via INTRAMUSCULAR

## 2020-10-28 MED ORDER — ROCURONIUM BROMIDE 10 MG/ML (PF) SYRINGE
PREFILLED_SYRINGE | INTRAVENOUS | Status: AC
  Filled 2020-10-28: qty 10

## 2020-10-28 MED ORDER — PROPOFOL 10 MG/ML IV BOLUS
INTRAVENOUS | Status: DC | PRN
  Administered 2020-10-28: 180 mg via INTRAVENOUS

## 2020-10-28 MED ORDER — CHLORHEXIDINE GLUCONATE 0.12 % MT SOLN: 15.0000 mL | Freq: Once | OROMUCOSAL | Status: AC

## 2020-10-28 MED ORDER — ACETAMINOPHEN 500 MG PO TABS
ORAL_TABLET | ORAL | Status: AC
  Administered 2020-10-28: 1000 mg via ORAL
  Filled 2020-10-28: qty 2

## 2020-10-28 MED ORDER — LIDOCAINE 2% (20 MG/ML) 5 ML SYRINGE
INTRAMUSCULAR | Status: AC
  Filled 2020-10-28: qty 10

## 2020-10-28 MED ORDER — CEFAZOLIN SODIUM-DEXTROSE 2-4 GM/100ML-% IV SOLN
INTRAVENOUS | Status: AC
  Filled 2020-10-28: qty 100

## 2020-10-28 MED ORDER — CEFAZOLIN SODIUM-DEXTROSE 2-4 GM/100ML-% IV SOLN
2.0000 g | INTRAVENOUS | Status: AC
  Administered 2020-10-28: 2 g via INTRAVENOUS

## 2020-10-28 MED ORDER — PHENYLEPHRINE HCL (PRESSORS) 10 MG/ML IV SOLN
INTRAVENOUS | Status: DC | PRN
  Administered 2020-10-28 (×2): 200 ug via INTRAVENOUS

## 2020-10-28 MED ORDER — PHENYLEPHRINE HCL-NACL 10-0.9 MG/250ML-% IV SOLN
INTRAVENOUS | Status: DC | PRN
  Administered 2020-10-28: 50 ug/min via INTRAVENOUS

## 2020-10-28 MED ORDER — SUGAMMADEX SODIUM 500 MG/5ML IV SOLN
INTRAVENOUS | Status: DC | PRN
  Administered 2020-10-28: 400 mg via INTRAVENOUS

## 2020-10-28 MED ORDER — BUPIVACAINE-EPINEPHRINE (PF) 0.25% -1:200000 IJ SOLN
INTRAMUSCULAR | Status: AC
  Filled 2020-10-28: qty 30

## 2020-10-28 MED ORDER — ACETAMINOPHEN 500 MG PO TABS: 1000.0000 mg | ORAL_TABLET | ORAL | Status: AC

## 2020-10-28 MED ORDER — ORAL CARE MOUTH RINSE: 15.0000 mL | Freq: Once | OROMUCOSAL | Status: AC

## 2020-10-28 MED ORDER — LACTATED RINGERS IV SOLN: INTRAVENOUS | Status: DC

## 2020-10-28 MED ORDER — ONDANSETRON HCL 4 MG/2ML IJ SOLN
4.0000 mg | Freq: Four times a day (QID) | INTRAMUSCULAR | Status: AC | PRN
  Administered 2020-10-28: 4 mg via INTRAVENOUS

## 2020-10-28 MED ORDER — OXYCODONE HCL 5 MG/5ML PO SOLN: 5.0000 mg | Freq: Once | ORAL | Status: DC | PRN

## 2020-10-28 MED ORDER — FENTANYL CITRATE (PF) 100 MCG/2ML IJ SOLN: 25.0000 ug | INTRAMUSCULAR | Status: DC | PRN

## 2020-10-28 MED ORDER — DEXAMETHASONE SODIUM PHOSPHATE 10 MG/ML IJ SOLN
INTRAMUSCULAR | Status: DC | PRN
  Administered 2020-10-28: 10 mg via INTRAVENOUS

## 2020-10-28 MED ORDER — MIDAZOLAM HCL 5 MG/5ML IJ SOLN
INTRAMUSCULAR | Status: DC | PRN
  Administered 2020-10-28: 2 mg via INTRAVENOUS

## 2020-10-28 MED ORDER — PROPOFOL 10 MG/ML IV BOLUS
INTRAVENOUS | Status: AC
  Filled 2020-10-28: qty 20

## 2020-10-28 MED ORDER — DEXAMETHASONE SODIUM PHOSPHATE 10 MG/ML IJ SOLN
INTRAMUSCULAR | Status: AC
  Filled 2020-10-28: qty 1

## 2020-10-28 MED ORDER — LIDOCAINE 2% (20 MG/ML) 5 ML SYRINGE
INTRAMUSCULAR | Status: AC
  Filled 2020-10-28: qty 5

## 2020-10-28 MED ORDER — LIDOCAINE HCL (CARDIAC) PF 100 MG/5ML IV SOSY
PREFILLED_SYRINGE | INTRAVENOUS | Status: DC | PRN
  Administered 2020-10-28: 50 mg via INTRAVENOUS

## 2020-10-28 MED ORDER — HYDROCODONE-ACETAMINOPHEN 5-325 MG PO TABS: 1.0000 | ORAL_TABLET | Freq: Four times a day (QID) | ORAL | 0 refills | Status: AC | PRN

## 2020-10-28 MED ORDER — TECHNETIUM TC 99M TILMANOCEPT KIT
0.5000 | PACK | Freq: Once | INTRAVENOUS | Status: AC | PRN
  Administered 2020-10-28: 0.5 via INTRADERMAL

## 2020-10-28 MED ORDER — PHENYLEPHRINE 40 MCG/ML (10ML) SYRINGE FOR IV PUSH (FOR BLOOD PRESSURE SUPPORT)
PREFILLED_SYRINGE | INTRAVENOUS | Status: AC
  Filled 2020-10-28: qty 10

## 2020-10-28 MED ORDER — CHLORHEXIDINE GLUCONATE 0.12 % MT SOLN
OROMUCOSAL | Status: AC
  Administered 2020-10-28: 15 mL via OROMUCOSAL
  Filled 2020-10-28: qty 15

## 2020-10-28 MED ORDER — GABAPENTIN 300 MG PO CAPS: 300.0000 mg | ORAL_CAPSULE | ORAL | Status: AC

## 2020-10-28 MED ORDER — ONDANSETRON HCL 4 MG/2ML IJ SOLN
INTRAMUSCULAR | Status: AC
  Filled 2020-10-28: qty 2

## 2020-10-28 MED ORDER — FENTANYL CITRATE (PF) 250 MCG/5ML IJ SOLN
INTRAMUSCULAR | Status: AC
  Filled 2020-10-28: qty 5

## 2020-10-28 MED ORDER — OXYCODONE HCL 5 MG PO TABS: 5.0000 mg | ORAL_TABLET | Freq: Once | ORAL | Status: DC | PRN

## 2020-10-28 SURGICAL SUPPLY — 55 items
ADH SKN CLS LQ APL DERMABOND (GAUZE/BANDAGES/DRESSINGS) ×2
APL PRP STRL LF DISP 70% ISPRP (MISCELLANEOUS) ×2
BLADE SURG 10 STRL SS (BLADE) ×3 IMPLANT
BNDG COHESIVE 4X5 TAN STRL (GAUZE/BANDAGES/DRESSINGS) IMPLANT
BNDG GAUZE ELAST 4 BULKY (GAUZE/BANDAGES/DRESSINGS) IMPLANT
CANISTER SUCT 3000ML PPV (MISCELLANEOUS) ×3 IMPLANT
CHLORAPREP W/TINT 26 (MISCELLANEOUS) ×3 IMPLANT
CLIP VESOCCLUDE SM WIDE 24/CT (CLIP) ×3 IMPLANT
CNTNR URN SCR LID CUP LEK RST (MISCELLANEOUS) ×4 IMPLANT
CONT SPEC 4OZ STRL OR WHT (MISCELLANEOUS) ×6
COVER MAYO STAND STRL (DRAPES) IMPLANT
COVER PROBE W GEL 5X96 (DRAPES) ×3 IMPLANT
COVER SURGICAL LIGHT HANDLE (MISCELLANEOUS) ×3 IMPLANT
COVER WAND RF STERILE (DRAPES) ×3 IMPLANT
DECANTER SPIKE VIAL GLASS SM (MISCELLANEOUS) ×3 IMPLANT
DERMABOND ADHESIVE PROPEN (GAUZE/BANDAGES/DRESSINGS) ×1
DERMABOND ADVANCED .7 DNX6 (GAUZE/BANDAGES/DRESSINGS) ×1 IMPLANT
DRAPE HALF SHEET 40X57 (DRAPES) ×3 IMPLANT
DRAPE LAPAROSCOPIC ABDOMINAL (DRAPES) IMPLANT
DRSG TEGADERM 4X4.75 (GAUZE/BANDAGES/DRESSINGS) IMPLANT
DRSG TELFA 3X8 NADH (GAUZE/BANDAGES/DRESSINGS) ×3 IMPLANT
ELECT REM PT RETURN 9FT ADLT (ELECTROSURGICAL) ×3
ELECTRODE REM PT RTRN 9FT ADLT (ELECTROSURGICAL) ×2 IMPLANT
GAUZE SPONGE 2X2 8PLY STRL LF (GAUZE/BANDAGES/DRESSINGS) IMPLANT
GLOVE SURG POLY MICRO LF SZ5.5 (GLOVE) ×3 IMPLANT
GLOVE SURG UNDER POLY LF SZ6 (GLOVE) ×3 IMPLANT
GOWN STRL REUS W/ TWL LRG LVL3 (GOWN DISPOSABLE) ×4 IMPLANT
GOWN STRL REUS W/TWL LRG LVL3 (GOWN DISPOSABLE) ×6
KIT BASIN OR (CUSTOM PROCEDURE TRAY) ×3 IMPLANT
KIT TURNOVER KIT B (KITS) ×3 IMPLANT
MARKER SKIN DUAL TIP RULER LAB (MISCELLANEOUS) ×3 IMPLANT
NDL 18GX1X1/2 (RX/OR ONLY) (NEEDLE) ×1 IMPLANT
NDL FILTER BLUNT 18X1 1/2 (NEEDLE) IMPLANT
NDL HYPO 25GX1X1/2 BEV (NEEDLE) ×1 IMPLANT
NEEDLE 18GX1X1/2 (RX/OR ONLY) (NEEDLE) ×3 IMPLANT
NEEDLE FILTER BLUNT 18X 1/2SAF (NEEDLE)
NEEDLE FILTER BLUNT 18X1 1/2 (NEEDLE) IMPLANT
NEEDLE HYPO 25GX1X1/2 BEV (NEEDLE) ×3 IMPLANT
NS IRRIG 1000ML POUR BTL (IV SOLUTION) ×3 IMPLANT
PACK GENERAL/GYN (CUSTOM PROCEDURE TRAY) ×3 IMPLANT
PACK UNIVERSAL I (CUSTOM PROCEDURE TRAY) IMPLANT
PAD ARMBOARD 7.5X6 YLW CONV (MISCELLANEOUS) ×6 IMPLANT
PAD DRESSING TELFA 3X8 NADH (GAUZE/BANDAGES/DRESSINGS) ×1 IMPLANT
PENCIL SMOKE EVACUATOR (MISCELLANEOUS) ×3 IMPLANT
SPONGE GAUZE 2X2 STER 10/PKG (GAUZE/BANDAGES/DRESSINGS)
STOCKINETTE IMPERVIOUS 9X36 MD (GAUZE/BANDAGES/DRESSINGS) IMPLANT
STRIP CLOSURE SKIN 1/2X4 (GAUZE/BANDAGES/DRESSINGS) IMPLANT
SUT ETHILON 2 0 FS 18 (SUTURE) ×3 IMPLANT
SUT MNCRL AB 4-0 PS2 18 (SUTURE) ×3 IMPLANT
SUT SILK 2 0 PERMA HAND 18 BK (SUTURE) ×3 IMPLANT
SUT VIC AB 3-0 SH 27 (SUTURE) ×6
SUT VIC AB 3-0 SH 27X BRD (SUTURE) ×3 IMPLANT
SYR CONTROL 10ML LL (SYRINGE) ×6 IMPLANT
TOWEL GREEN STERILE (TOWEL DISPOSABLE) ×3 IMPLANT
TOWEL GREEN STERILE FF (TOWEL DISPOSABLE) ×3 IMPLANT

## 2020-10-28 NOTE — Anesthesia Preprocedure Evaluation (Signed)
Anesthesia Evaluation  Patient identified by MRN, date of birth, ID band Patient awake    Reviewed: Allergy & Precautions, H&P , NPO status , Patient's Chart, lab work & pertinent test results  Airway Mallampati: II   Neck ROM: full    Dental   Pulmonary neg pulmonary ROS,    breath sounds clear to auscultation       Cardiovascular hypertension,  Rhythm:regular Rate:Normal     Neuro/Psych PSYCHIATRIC DISORDERS Anxiety Depression    GI/Hepatic H/o colon CA   Endo/Other  diabetes, Type 2  Renal/GU stones     Musculoskeletal   Abdominal   Peds  Hematology   Anesthesia Other Findings   Reproductive/Obstetrics                             Anesthesia Physical Anesthesia Plan  ASA: 2  Anesthesia Plan: General   Post-op Pain Management:    Induction: Intravenous  PONV Risk Score and Plan: 2 and Ondansetron, Dexamethasone, Midazolam and Treatment may vary due to age or medical condition  Airway Management Planned: Oral ETT  Additional Equipment:   Intra-op Plan:   Post-operative Plan: Extubation in OR  Informed Consent: I have reviewed the patients History and Physical, chart, labs and discussed the procedure including the risks, benefits and alternatives for the proposed anesthesia with the patient or authorized representative who has indicated his/her understanding and acceptance.     Dental advisory given  Plan Discussed with: CRNA, Anesthesiologist and Surgeon  Anesthesia Plan Comments:         Anesthesia Quick Evaluation

## 2020-10-28 NOTE — Discharge Instructions (Signed)
[facility] DISCHARGE INSTRUCTIONS  Activity No heavy lifting greater than 10 pounds with the right arm, until you return for your postoperative visit. Ok to shower in 24 hours, but do not bathe or submerge incision underwater. Do not drive while taking narcotic pain medication.  Wound Care Your incision is covered with skin glue called Dermabond. This will peel off on its own over time. You may shower in 24 hours and allow warm soapy water to run over your incisions. Gently pat dry. Do not submerge your incision underwater. Monitor your incision for any new redness, tenderness, or drainage.  When to Call us: Fever greater than 100.5 New redness, drainage, or swelling at incision site Severe pain, nausea, or vomiting  Follow-up You have an appointment scheduled with Dr. Zenia Resides on November 12, 2020 at 3:00pm. This will be at the [facility] office at [address], [place], [place], [place]. Please arrive at least 15 minutes prior to your scheduled appointment time.  For questions or concerns, please call the office at [phone].

## 2020-10-28 NOTE — Op Note (Signed)
Date: 10/28/20  Patient: Robert Moore MRN: [id number]  Preoperative Diagnosis: T1b melanoma Postoperative Diagnosis: Same  Procedure: Wide local excision of right posterior shoulder melanoma  Surgeon: Michaelle Birks, MD  EBL: Minimal  Anesthesia: General endotracheal  Specimens: Right shoulder melanoma (short stitch marks superior margin, long stitch marks lateral margin)  Indications: [name] is a 60 yo male who presented after a shave biopsy of a suspicious lesion on his right posterior shoulder at the base of the neck showed a 0.69mm thick melanoma. There was no lymphovascular invasion or ulceration, however there was a focal area of involvement of a deep margin, thus the decision was made to perform a sentinel lymph node biopsy in addition to wide local excision.  Findings: Wide local excision with 1cm margins performed of the primary lesion (total excised area of 3x7cm). Preoperative lymphoscintigraphy was performed twice and the primary tumor failed to map to any lymphatic basins on both studies. The gamma probe was used intraoperative to identify an active nodal basin but none were identified, so a node biopsy was not performed.  Procedure details: Informed consent was obtained in the preoperative area prior to the procedure. The patient was brought to the operating room, general anesthesia was induced and perioperative antibiotics were administered per SCIP guidelines. The gamma probe was placed on the right neck, right supraclavicular area and right axilla and no positive areas were identified. The patient was placed in the prone position, then prepped and draped in the usual sterile fashion. A pre-procedure timeout was taken verifying patient identity, surgical site and procedure to be performed.  1cm margins were measured and marked out circumferentially around the primary lesion on the right posterior shoulder. An elliptical incision was made to include these margins, with the  long axis of the incision oriented along the natural tension lines of the skin. The subcutaneous tissue was divided down to the muscle fascia using cautery, and the specimen was taken off the fascia using cautery. The specimen was oriented and sent for routine pathology. The gamma probe was again placed on the neck and supraclavicular areas after removal of the primary and no hot nodes were identified. The wound was irrigated and hemostasis was achieved. The subcutaneous tissue was closed with simple interrupted deep dermal sutures. The skin was closed with a running subcuticular 4-0 monocryl suture. Dermabond was applied.   The patient tolerated the procedure with no apparent complications. All counts were correct x2 at the end of the procedure. The patient was extubated and taken to PACU in stable condition.  Michaelle Birks, MD 10/28/20 4:15 PM

## 2020-10-28 NOTE — Anesthesia Procedure Notes (Signed)
Procedure Name: Intubation Date/Time: 10/28/2020 4:29 PM Performed by: Jonna Munro, CRNA Pre-anesthesia Checklist: Patient identified, Emergency Drugs available, Suction available, Patient being monitored and Timeout performed Patient Re-evaluated:Patient Re-evaluated prior to induction Oxygen Delivery Method: Circle system utilized Preoxygenation: Pre-oxygenation with 100% oxygen Induction Type: IV induction Ventilation: Mask ventilation without difficulty Laryngoscope Size: Mac and 4 Grade View: Grade II Tube type: Oral Tube size: 7.5 mm Number of attempts: 1 Airway Equipment and Method: Stylet Placement Confirmation: positive ETCO2, ETT inserted through vocal cords under direct vision and breath sounds checked- equal and bilateral Secured at: 22 cm Tube secured with: Tape Dental Injury: Teeth and Oropharynx as per pre-operative assessment

## 2020-10-28 NOTE — Interval H&P Note (Signed)
History and Physical Interval Note:  10/28/2020 3:03 PM  Robert Moore  has presented today for surgery, with the diagnosis of MELANOMA.  The various methods of treatment have been discussed with the patient and family. After consideration of risks, benefits and other options for treatment, the patient has consented to  Procedure(s): WIDE LOCAL EXCISION BACK MELANOMA WITH WITH SENTINEL LYMPH NODE BIOPSY AXILLARY AND CERVICAL (N/A) as a surgical intervention.  The patient's history has been reviewed, patient examined, no change in status, stable for surgery.  I have reviewed the patient's chart and labs.  Questions were answered to the patient's satisfaction.  Lymphoscintigraphy x2 failed to map to any lymph node basins. Will use neoprobe intraop to search for an active lymph node basin, but I anticipate there will be no hot nodes and in that case will perform a wide local excision alone of the primary lesion. I discussed this with the patient.   [name]

## 2020-10-28 NOTE — Transfer of Care (Signed)
Immediate Anesthesia Transfer of Care Note  Patient: Robert Moore  Procedure(s) Performed: WIDE LOCAL EXCISION BACK MELANOMA WITH WITH SENTINEL LYMPH NODE BIOPSY AXILLARY AND CERVICAL  Patient Location: PACU  Anesthesia Type:General  Level of Consciousness: awake, alert , oriented and patient cooperative  Airway & Oxygen Therapy: Patient Spontanous Breathing  Post-op Assessment: Report given to RN, Post -op Vital signs reviewed and stable and Patient moving all extremities X 4  Post vital signs: Reviewed and stable  Last Vitals:  Vitals Value Taken Time  BP 120/81 10/28/20 1625  Temp 36.6 C 10/28/20 1625  Pulse 73 10/28/20 1627  Resp 14 10/28/20 1627  SpO2 95 % 10/28/20 1627  Vitals shown include unvalidated device data.  Last Pain:  Vitals:   10/28/20 1237  TempSrc:   PainSc: 0-No pain      Patients Stated Pain Goal: 5 (40/97/35 3299)  Complications: No notable events documented.

## 2020-10-29 ENCOUNTER — Encounter (HOSPITAL_COMMUNITY): Payer: Self-pay | Admitting: Surgery

## 2020-10-29 MED ORDER — 0.9 % SODIUM CHLORIDE (POUR BTL) OPTIME
TOPICAL | Status: DC | PRN
  Administered 2020-10-28: 1000 mL

## 2020-10-31 NOTE — Anesthesia Postprocedure Evaluation (Signed)
Anesthesia Post Note  Patient: Robert Moore  Procedure(s) Performed: WIDE LOCAL EXCISION OF RIGHT POSTERIOR SHOULDER MELANOMA (Right: Shoulder)     Patient location during evaluation: PACU Anesthesia Type: General Level of consciousness: awake and alert Pain management: pain level controlled Vital Signs Assessment: post-procedure vital signs reviewed and stable Respiratory status: spontaneous breathing, nonlabored ventilation, respiratory function stable and patient connected to nasal cannula oxygen Cardiovascular status: blood pressure returned to baseline and stable Postop Assessment: no apparent nausea or vomiting Anesthetic complications: no   No notable events documented.  Last Vitals:  Vitals:   10/28/20 1640 10/28/20 1655  BP: 123/76 132/79  Pulse: 69 72  Resp: 13 11  Temp:  36.6 C  SpO2: 95% 98%    Last Pain:  Vitals:   10/28/20 1625  TempSrc:   PainSc: Asleep   Pain Goal: Patients Stated Pain Goal: 5 (10/28/20 1237)                 [place]

## 2020-11-04 LAB — SURGICAL PATHOLOGY

## 2023-04-19 ENCOUNTER — Other Ambulatory Visit: Payer: Self-pay | Admitting: Urology

## 2023-04-19 DIAGNOSIS — R310 Gross hematuria: Secondary | ICD-10-CM
